# Patient Record
Sex: Female | Born: 1980 | Race: White | Hispanic: No | Marital: Single | Smoking: Former smoker
Health system: Southern US, Community
[De-identification: ages and names within clinical notes are randomized; demographics above are authoritative.]

## PROBLEM LIST (undated history)

## (undated) DIAGNOSIS — N12 Tubulo-interstitial nephritis, not specified as acute or chronic: Secondary | ICD-10-CM

## (undated) DIAGNOSIS — K219 Gastro-esophageal reflux disease without esophagitis: Secondary | ICD-10-CM

## (undated) DIAGNOSIS — R519 Headache, unspecified: Secondary | ICD-10-CM

## (undated) DIAGNOSIS — T884XXA Failed or difficult intubation, initial encounter: Secondary | ICD-10-CM

## (undated) DIAGNOSIS — R51 Headache: Secondary | ICD-10-CM

## (undated) DIAGNOSIS — F419 Anxiety disorder, unspecified: Secondary | ICD-10-CM

## (undated) DIAGNOSIS — M26629 Arthralgia of temporomandibular joint, unspecified side: Secondary | ICD-10-CM

## (undated) DIAGNOSIS — E041 Nontoxic single thyroid nodule: Secondary | ICD-10-CM

## (undated) DIAGNOSIS — Z87442 Personal history of urinary calculi: Secondary | ICD-10-CM

## (undated) DIAGNOSIS — M797 Fibromyalgia: Secondary | ICD-10-CM

## (undated) DIAGNOSIS — Z8489 Family history of other specified conditions: Secondary | ICD-10-CM

## (undated) DIAGNOSIS — T4145XA Adverse effect of unspecified anesthetic, initial encounter: Secondary | ICD-10-CM

## (undated) DIAGNOSIS — E162 Hypoglycemia, unspecified: Secondary | ICD-10-CM

## (undated) DIAGNOSIS — D649 Anemia, unspecified: Secondary | ICD-10-CM

## (undated) DIAGNOSIS — R569 Unspecified convulsions: Secondary | ICD-10-CM

## (undated) DIAGNOSIS — T8859XA Other complications of anesthesia, initial encounter: Secondary | ICD-10-CM

## (undated) DIAGNOSIS — C801 Malignant (primary) neoplasm, unspecified: Secondary | ICD-10-CM

## (undated) DIAGNOSIS — IMO0001 Reserved for inherently not codable concepts without codable children: Secondary | ICD-10-CM

## (undated) HISTORY — PX: TENDON REPAIR: SHX5111

## (undated) HISTORY — PX: OTHER SURGICAL HISTORY: SHX169

## (undated) HISTORY — PX: WISDOM TOOTH EXTRACTION: SHX21

## (undated) HISTORY — PX: LAPAROSCOPIC ABDOMINAL EXPLORATION: SHX6249

---

## 2012-11-26 DIAGNOSIS — IMO0001 Reserved for inherently not codable concepts without codable children: Secondary | ICD-10-CM | POA: Insufficient documentation

## 2012-11-26 DIAGNOSIS — J40 Bronchitis, not specified as acute or chronic: Secondary | ICD-10-CM | POA: Insufficient documentation

## 2012-11-26 DIAGNOSIS — E282 Polycystic ovarian syndrome: Secondary | ICD-10-CM | POA: Insufficient documentation

## 2012-11-26 DIAGNOSIS — M4120 Other idiopathic scoliosis, site unspecified: Secondary | ICD-10-CM | POA: Insufficient documentation

## 2012-11-26 DIAGNOSIS — E162 Hypoglycemia, unspecified: Secondary | ICD-10-CM | POA: Insufficient documentation

## 2014-11-12 ENCOUNTER — Emergency Department (HOSPITAL_BASED_OUTPATIENT_CLINIC_OR_DEPARTMENT_OTHER)
Admission: EM | Admit: 2014-11-12 | Discharge: 2014-11-12 | Disposition: A | Discharge status: Home / Self Care | Payer: Medicaid Other | Attending: Emergency Medicine | Admitting: Emergency Medicine

## 2014-11-12 ENCOUNTER — Emergency Department (HOSPITAL_BASED_OUTPATIENT_CLINIC_OR_DEPARTMENT_OTHER): Payer: Medicaid Other

## 2014-11-12 ENCOUNTER — Encounter (HOSPITAL_BASED_OUTPATIENT_CLINIC_OR_DEPARTMENT_OTHER): Payer: Self-pay | Admitting: *Deleted

## 2014-11-12 DIAGNOSIS — Z3202 Encounter for pregnancy test, result negative: Secondary | ICD-10-CM | POA: Insufficient documentation

## 2014-11-12 DIAGNOSIS — K429 Umbilical hernia without obstruction or gangrene: Secondary | ICD-10-CM | POA: Insufficient documentation

## 2014-11-12 DIAGNOSIS — R1031 Right lower quadrant pain: Secondary | ICD-10-CM | POA: Diagnosis present

## 2014-11-12 LAB — CBC WITH DIFFERENTIAL/PLATELET
Basophils Absolute: 0 10*3/uL (ref 0.0–0.1)
Basophils Relative: 0 % (ref 0–1)
Eosinophils Absolute: 0.4 10*3/uL (ref 0.0–0.7)
Eosinophils Relative: 4 % (ref 0–5)
HEMATOCRIT: 45.4 % (ref 36.0–46.0)
Hemoglobin: 15.2 g/dL — ABNORMAL HIGH (ref 12.0–15.0)
LYMPHS ABS: 3.1 10*3/uL (ref 0.7–4.0)
Lymphocytes Relative: 28 % (ref 12–46)
MCH: 30.3 pg (ref 26.0–34.0)
MCHC: 33.5 g/dL (ref 30.0–36.0)
MCV: 90.4 fL (ref 78.0–100.0)
MONOS PCT: 7 % (ref 3–12)
Monocytes Absolute: 0.8 10*3/uL (ref 0.1–1.0)
NEUTROS ABS: 6.9 10*3/uL (ref 1.7–7.7)
NEUTROS PCT: 61 % (ref 43–77)
Platelets: 180 10*3/uL (ref 150–400)
RBC: 5.02 MIL/uL (ref 3.87–5.11)
RDW: 13.3 % (ref 11.5–15.5)
WBC: 11.2 10*3/uL — AB (ref 4.0–10.5)

## 2014-11-12 LAB — COMPREHENSIVE METABOLIC PANEL
ALK PHOS: 48 U/L (ref 38–126)
ALT: 13 U/L — AB (ref 14–54)
AST: 16 U/L (ref 15–41)
Albumin: 4.3 g/dL (ref 3.5–5.0)
Anion gap: 10 (ref 5–15)
BUN: 8 mg/dL (ref 6–20)
CO2: 24 mmol/L (ref 22–32)
CREATININE: 0.77 mg/dL (ref 0.44–1.00)
Calcium: 9.3 mg/dL (ref 8.9–10.3)
Chloride: 106 mmol/L (ref 101–111)
GFR calc non Af Amer: >60 mL/min (ref >60)
Glucose, Bld: 86 mg/dL (ref 65–99)
Potassium: 3.9 mmol/L (ref 3.5–5.1)
Sodium: 140 mmol/L (ref 135–145)
Total Bilirubin: 0.8 mg/dL (ref 0.3–1.2)
Total Protein: 7.1 g/dL (ref 6.5–8.1)

## 2014-11-12 LAB — URINALYSIS, ROUTINE W REFLEX MICROSCOPIC
BILIRUBIN URINE: NEGATIVE
GLUCOSE, UA: NEGATIVE mg/dL
HGB URINE DIPSTICK: NEGATIVE
Ketones, ur: 15 mg/dL — AB
Leukocytes, UA: NEGATIVE
NITRITE: NEGATIVE
PH: 6.5 (ref 5.0–8.0)
PROTEIN: NEGATIVE mg/dL
SPECIFIC GRAVITY, URINE: 1.019 (ref 1.005–1.030)
UROBILINOGEN UA: 1 mg/dL (ref 0.0–1.0)

## 2014-11-12 LAB — PREGNANCY, URINE: PREG TEST UR: NEGATIVE

## 2014-11-12 LAB — LIPASE, BLOOD: Lipase: 41 U/L (ref 22–51)

## 2014-11-12 MED ORDER — IOHEXOL 300 MG/ML  SOLN
100.0000 mL | Freq: Once | INTRAMUSCULAR | Status: AC | PRN
  Administered 2014-11-12: 100 mL via INTRAVENOUS

## 2014-11-12 MED ORDER — ONDANSETRON HCL 4 MG PO TABS: 4.0000 mg | ORAL_TABLET | Freq: Four times a day (QID) | ORAL | Status: DC

## 2014-11-12 MED ORDER — IOHEXOL 300 MG/ML  SOLN
50.0000 mL | Freq: Once | INTRAMUSCULAR | Status: AC | PRN
  Administered 2014-11-12: 50 mL via ORAL

## 2014-11-12 MED ORDER — OXYCODONE-ACETAMINOPHEN 5-325 MG PO TABS: 1.0000 | ORAL_TABLET | Freq: Four times a day (QID) | ORAL | Status: DC | PRN

## 2014-11-12 NOTE — ED Notes (Signed)
Patient transported to CT 

## 2014-11-12 NOTE — ED Provider Notes (Signed)
CSN: [id number]     Arrival date & time 11/12/14  1539 History   First MD Initiated Contact with Patient 11/12/14 1606     Chief Complaint  Patient presents with  . Hernia     (Consider location/radiation/quality/duration/timing/severity/associated sxs/prior Treatment) HPI   PCP: No PCP Per Patient Blood pressure 108/57, pulse 94, temperature 98.3 F (36.8 C), temperature source Oral, resp. rate 18, height 5\' 5"  (1.651 m), weight 140 lb (63.504 kg), last menstrual period 10/22/2014, SpO2 100 %.  Kylie Curtis is a 33 y.o.female with a significant PMH of right hand surgery presents to the ER with complaints of nominal pain and nausea for 4 days. Sent here from [place] [facility] for imaging and blood work. The patient has had pain for 4 days she describes as sharp in the period umbilical region and radiates to the right lower quadrant, left upper quadrant area.  she describes it as sharp and intermittently crampy pains. Is not had any vomiting or diarrhea. She denies dysuria, hematuria, SOB, CP, headaches, weakness fatigue or any other associated symptoms.   History reviewed. No pertinent past medical history. Past Surgical History  Procedure Laterality Date  . Right hand surgery     History reviewed. No pertinent family history. History  Substance Use Topics  . Smoking status: Never Smoker   . Smokeless tobacco: Not on file  . Alcohol Use: No   OB History    No data available     Review of Systems  10 Systems reviewed and are negative for acute change except as noted in the HPI.     Allergies  Codeine; Morphine and related; and Tramadol  Home Medications   Prior to Admission medications   Not on File   BP 108/57 mmHg  Pulse 94  Temp(Src) 98.3 F (36.8 C) (Oral)  Resp 18  Ht 5\' 5"  (1.651 m)  Wt 140 lb (63.504 kg)  BMI 23.30 kg/m2  SpO2 100%  LMP 10/22/2014 Physical Exam  Constitutional: She appears well-developed and well-nourished. No distress.   HENT:  Head: Normocephalic and atraumatic.  Eyes: Pupils are equal, round, and reactive to light.  Neck: Normal range of motion. Neck supple.  Cardiovascular: Normal rate and regular rhythm.   Pulmonary/Chest: Effort normal and breath sounds normal. She has no decreased breath sounds. She has no wheezes.  Abdominal: Soft. Bowel sounds are normal. She exhibits no distension and no fluid wave. There is tenderness in the right lower quadrant, periumbilical area and left upper quadrant. There is no rigidity, no rebound and no guarding.  Neurological: She is alert.  Skin: Skin is warm and dry.  Nursing note and vitals reviewed.   ED Course  Procedures (including critical care time) Labs Review Labs Reviewed  COMPREHENSIVE METABOLIC PANEL  LIPASE, BLOOD  CBC WITH DIFFERENTIAL/PLATELET  URINALYSIS, ROUTINE W REFLEX MICROSCOPIC  PREGNANCY, URINE    Imaging Review No results found.   EKG Interpretation None      MDM   Final diagnoses:  Umbilical hernia     Patient's lab work and CT scan are reassuring- No acute or emergent findings.. Physical exam patient does not currently have any palpable hernia. I will refer her to [facility] surgery for elective hernia repair. I've discussed the results and the plan with the patient. I've discussed return to emergency Department precautions.  Declined pain medication while in the emergency department but has agreed to take a prescription for Zofran and Percocet in case her pain returns.  She's been advised that if her hernia pops out and she is unable to reduce it herself that she will need to return to the emergency department.  34 y.o.Kylie Curtis's evaluation in the Emergency Department is complete. It has been determined that no acute conditions requiring further emergency intervention are present at this time. The patient/guardian have been advised of the diagnosis and plan. We have discussed signs and symptoms that warrant  return to the ED, such as changes or worsening in symptoms.  Vital signs are stable at discharge. Filed Vitals:   11/12/14 1834  BP: 107/64  Pulse: 66  Temp:   Resp: 16    Patient/guardian has voiced understanding and agreed to follow-up with the PCP or specialist.   Delos Haring, PA-C 11/12/14 [place], MD 11/12/14 2328

## 2014-11-12 NOTE — Discharge Instructions (Signed)
Hernia A hernia occurs when an internal organ pushes out through a weak spot in the abdominal wall. Hernias most commonly occur in the groin and around the navel. Hernias often can be pushed back into place (reduced). Most hernias tend to get worse over time. Some abdominal hernias can get stuck in the opening (irreducible or incarcerated hernia) and cannot be reduced. An irreducible abdominal hernia which is tightly squeezed into the opening is at risk for impaired blood supply (strangulated hernia). A strangulated hernia is a medical emergency. Because of the risk for an irreducible or strangulated hernia, surgery may be recommended to repair a hernia. CAUSES   Heavy lifting.  Prolonged coughing.  Straining to have a bowel movement.  A cut (incision) made during an abdominal surgery. HOME CARE INSTRUCTIONS   Bed rest is not required. You may continue your normal activities.  Avoid lifting more than 10 pounds (4.5 kg) or straining.  Cough gently. If you are a smoker it is best to stop. Even the best hernia repair can break down with the continual strain of coughing. Even if you do not have your hernia repaired, a cough will continue to aggravate the problem.  Do not wear anything tight over your hernia. Do not try to keep it in with an outside bandage or truss. These can damage abdominal contents if they are trapped within the hernia sac.  Eat a normal diet.  Avoid constipation. Straining over long periods of time will increase hernia size and encourage breakdown of repairs. If you cannot do this with diet alone, stool softeners may be used. SEEK IMMEDIATE MEDICAL CARE IF:   You have a fever.  You develop increasing abdominal pain.  You feel nauseous or vomit.  Your hernia is stuck outside the abdomen, looks discolored, feels hard, or is tender.  You have any changes in your bowel habits or in the hernia that are unusual for you.  You have increased pain or swelling around the  hernia.  You cannot push the hernia back in place by applying gentle pressure while lying down. MAKE SURE YOU:   Understand these instructions.  Will watch your condition.  Will get help right away if you are not doing well or get worse. Document Released: 06/08/2005 Document Revised: 08/31/2011 Document Reviewed: 01/26/2008 [facility] Patient Information 2015 [place], [place]. This information is not intended to replace advice given to you by your health care provider. Make sure you discuss any questions you have with your health care provider.  Hernia Repair with Laparoscope A hernia occurs when an internal organ pushes out through a weak spot in the belly (abdominal) wall muscles. Hernias most commonly occur in the groin and around the navel. Hernias can also occur through a cut by the surgeon (incision) after an abdominal operation. A hernia may be caused by:  Lifting heavy objects.  Prolonged coughing.  Straining to move your bowels. Hernias can often be pushed back into place (reduced). Most hernias tend to get worse over time. Problems occur when abdominal contents get stuck in the opening and the blood supply is blocked or impaired (incarcerated hernia). Because of these risks, you require surgery to repair the hernia. Your hernia will be repaired using a laparoscope. Laparoscopic surgery is a type of minimally invasive surgery. It does not involve making a typical surgical cut (incision) in the skin. A laparoscope is a telescope-like rod and lens system. It is usually connected to a video camera and a light source so your caregiver  can clearly see the operative area. The instruments are inserted through  to  inch (5 mm or 10 mm) openings in the skin at specific locations. A working and viewing space is created by blowing a small amount of carbon dioxide gas into the abdominal cavity. The abdomen is essentially blown up like a balloon (insufflated). This elevates the abdominal wall above  the internal organs like a dome. The carbon dioxide gas is common to the human body and can be absorbed by tissue and removed by the respiratory system. Once the repair is completed, the small incisions will be closed with either stitches (sutures) or staples (just like a paper stapler only this staple holds the skin together). LET YOUR CAREGIVERS KNOW ABOUT:  Allergies.  Medications taken including herbs, eye drops, over the counter medications, and creams.  Use of steroids (by mouth or creams).  Previous problems with anesthetics or Novocaine.  Possibility of pregnancy, if this applies.  History of blood clots (thrombophlebitis).  History of bleeding or blood problems.  Previous surgery.  Other health problems. BEFORE THE PROCEDURE  Laparoscopy can be done either in a hospital or out-patient clinic. You may be given a mild sedative to help you relax before the procedure. Once in the operating room, you will be given a general anesthesia to make you sleep (unless you and your caregiver choose a different anesthetic).  AFTER THE PROCEDURE  After the procedure you will be watched in a recovery area. Depending on what type of hernia was repaired, you might be admitted to the hospital or you might go home the same day. With this procedure you may have less pain and scarring. This usually results in a quicker recovery and less risk of infection. HOME CARE INSTRUCTIONS   Bed rest is not required. You may continue your normal activities but avoid heavy lifting (more than 10 pounds) or straining.  Cough gently. If you are a smoker it is best to stop, as even the best hernia repair can break down with the continual strain of coughing.  Avoid driving until given the OK by your surgeon.  There are no dietary restrictions unless given otherwise.  TAKE ALL MEDICATIONS AS DIRECTED.  Only take over-the-counter or prescription medicines for pain, discomfort, or fever as directed by your  caregiver. SEEK MEDICAL CARE IF:   There is increasing abdominal pain or pain in your incisions.  There is more bleeding from incisions, other than minimal spotting.  You feel light headed or faint.  You develop an unexplained fever, chills, and/or an oral temperature above 102 F (38.9 C).  You have redness, swelling, or increasing pain in the wound.  Pus coming from wound.  A foul smell coming from the wound or dressings. SEEK IMMEDIATE MEDICAL CARE IF:   You develop a rash.  You have difficulty breathing.  You have any allergic problems. MAKE SURE YOU:   Understand these instructions.  Will watch your condition.  Will get help right away if you are not doing well or get worse. Document Released: 06/08/2005 Document Revised: 08/31/2011 Document Reviewed: 05/08/2009 [facility] Patient Information 2015 [place], [place]. This information is not intended to replace advice given to you by your health care provider. Make sure you discuss any questions you have with your health care provider.

## 2014-11-12 NOTE — ED Notes (Signed)
Umbilical hernia pain x 4 days.  Nauseated, denies vomiting.

## 2014-12-11 ENCOUNTER — Ambulatory Visit: Payer: Self-pay | Admitting: General Surgery

## 2014-12-11 NOTE — H&P (Signed)
History of Present Illness Kylie Ok MD; 11/28/2014 2:54 PM) Patient words: umb hernia.  The patient is a 34 year old female who presents with an umbilical hernia. Patient is a 34 year old female who is here today secondary to an umbilical hernia. Patient states that she noticed a hernia approximate 4 years ago while being pregnant with her child. She states that after giving birth she had no pain. She states that pain is recently presented self. It waxes and wanes. Patient has had no signs or symptoms of incarceration or strangulation. She is able to manually reduce it.   Other Problems Elbert Ewings, CMA; 11/28/2014 2:23 PM) Back Pain Kidney Stone Migraine Headache Other disease, cancer, significant illness Umbilical Hernia Repair  Diagnostic Studies History Elbert Ewings, CMA; 11/28/2014 2:23 PM) Colonoscopy never Mammogram never Pap Smear 1-5 years ago  Allergies Elbert Ewings, CMA; 11/28/2014 2:25 PM) Codeine Phosphate *ANALGESICS - OPIOID* Itching. Morphine Sulfate ER *ANALGESICS - OPIOID* Itching. TraMADol HCl *CHEMICALS* Itching.  Medication History Elbert Ewings, CMA; 11/28/2014 2:25 PM) Oxycodone-Acetaminophen (5-325MG  Tablet, Oral) Active. Medications Reconciled  Social History Elbert Ewings, [place]; 11/28/2014 2:23 PM) Alcohol use Occasional alcohol use. Caffeine use Coffee, Tea. No drug use Tobacco use Current every day smoker.  Family History Elbert Ewings, [place]; 11/28/2014 2:23 PM) Alcohol Abuse Family Members In General. Arthritis Father. Diabetes Mellitus Family Members In General. Heart Disease Family Members In General. Heart disease in female family member before age 54 Hypertension Family Members In General. Ovarian Cancer Mother. Seizure disorder Family Members In General. Thyroid problems Mother.  Pregnancy / Birth History Elbert Ewings, CMA; 11/28/2014 2:23 PM) Age at menarche 41 years. Contraceptive History Oral  contraceptives. Gravida 1 Irregular periods Maternal age 25-30 Para 1  Review of Systems Elbert Ewings CMA; 11/28/2014 2:23 PM) General Not Present- Appetite Loss, Chills, Fatigue, Fever, Night Sweats, Weight Gain and Weight Loss. Skin Not Present- Change in Wart/Mole, Dryness, Hives, Jaundice, New Lesions, Non-Healing Wounds, Rash and Ulcer. HEENT Present- Seasonal Allergies. Not Present- Earache, Hearing Loss, Hoarseness, Nose Bleed, Oral Ulcers, Ringing in the Ears, Sinus Pain, Sore Throat, Visual Disturbances, Wears glasses/contact lenses and Yellow Eyes. Respiratory Present- Wheezing. Not Present- Bloody sputum, Chronic Cough, Difficulty Breathing and Snoring. Breast Not Present- Breast Mass, Breast Pain, Nipple Discharge and Skin Changes. Cardiovascular Not Present- Chest Pain, Difficulty Breathing Lying Down, Leg Cramps, Palpitations, Rapid Heart Rate, Shortness of Breath and Swelling of Extremities. Gastrointestinal Present- Abdominal Pain, Bloating, Change in Bowel Habits, Excessive gas, Indigestion and Nausea. Not Present- Bloody Stool, Chronic diarrhea, Constipation, Difficulty Swallowing, Gets full quickly at meals, Hemorrhoids, Rectal Pain and Vomiting. Female Genitourinary Present- Pelvic Pain. Not Present- Frequency, Nocturia, Painful Urination and Urgency. Musculoskeletal Present- Back Pain, Joint Pain and Joint Stiffness. Not Present- Muscle Pain, Muscle Weakness and Swelling of Extremities. Neurological Present- Headaches, Numbness and Tingling. Not Present- Decreased Memory, Fainting, Seizures, Tremor, Trouble walking and Weakness. Psychiatric Not Present- Anxiety, Bipolar, Change in Sleep Pattern, Depression, Fearful and Frequent crying. Endocrine Present- Excessive Hunger and Hot flashes. Not Present- Cold Intolerance, Hair Changes, Heat Intolerance and New Diabetes. Hematology Not Present- Easy Bruising, Excessive bleeding, Gland problems, HIV and Persistent  Infections.   Vitals Elbert Ewings CMA; 11/28/2014 2:26 PM) 11/28/2014 2:25 PM Weight: 140 lb Height: 65in Body Surface Area: 1.71 m Body Mass Index: 23.3 kg/m Temp.: 98.82F(Oral)  Pulse: 86 (Regular)  Resp.: 17 (Unlabored)  BP: 130/70 (Sitting, Left Arm, Standard)    Physical Exam Kylie Ok MD; 11/28/2014 2:54 PM) General Mental Status-Alert.  General Appearance-Consistent with stated age. Hydration-Well hydrated. Voice-Normal.  Head and Neck Head-normocephalic, atraumatic with no lesions or palpable masses. Trachea-midline. Thyroid Gland Characteristics - normal size and consistency.  Chest and Lung Exam Chest and lung exam reveals -quiet, even and easy respiratory effort with no use of accessory muscles and on auscultation, normal breath sounds, no adventitious sounds and normal vocal resonance. Inspection Chest Wall - Normal. Back - normal.  Cardiovascular Cardiovascular examination reveals -normal heart sounds, regular rate and rhythm with no murmurs and normal pedal pulses bilaterally.  Abdomen Inspection Skin - Scar - no surgical scars. Hernias - Umbilical hernia - Incarcerated. Palpation/Percussion Normal exam - Soft, Non Tender, No Rebound tenderness, No Rigidity (guarding) and No hepatosplenomegaly. Auscultation Normal exam - Bowel sounds normal.    Assessment & Plan Kylie Ok MD; 08/28/298 9:23 PM) UMBILICAL HERNIA WITHOUT OBSTRUCTION AND WITHOUT GANGRENE (553.1  K42.9) Impression: 34 year old female with a reducible umbilical hernia  1. The patient will like to proceed to the operating for a laparoscopic umbilical hernia repair with mesh. 2. All risks and benefits were discussed with the patient to generally include, but not limited to: infection, bleeding, damage to surrounding structures, acute and chronic nerve pain, and recurrence. Alternatives were offered and described. All questions were answered and the patient  voiced understanding of the procedure and wishes to proceed at this point with hernia repair.

## 2014-12-14 ENCOUNTER — Ambulatory Visit: Payer: Self-pay | Admitting: General Surgery

## 2014-12-14 ENCOUNTER — Encounter (HOSPITAL_COMMUNITY): Payer: Self-pay

## 2014-12-14 ENCOUNTER — Encounter (HOSPITAL_COMMUNITY)
Admission: RE | Admit: 2014-12-14 | Discharge: 2014-12-14 | Disposition: A | Discharge status: Home / Self Care | Payer: Medicaid Other | Source: Ambulatory Visit | Attending: General Surgery | Admitting: General Surgery

## 2014-12-14 ENCOUNTER — Encounter (INDEPENDENT_AMBULATORY_CARE_PROVIDER_SITE_OTHER): Payer: Self-pay

## 2014-12-14 DIAGNOSIS — Z01812 Encounter for preprocedural laboratory examination: Secondary | ICD-10-CM | POA: Diagnosis present

## 2014-12-14 DIAGNOSIS — K429 Umbilical hernia without obstruction or gangrene: Secondary | ICD-10-CM | POA: Insufficient documentation

## 2014-12-14 HISTORY — DX: Adverse effect of unspecified anesthetic, initial encounter: T41.45XA

## 2014-12-14 HISTORY — DX: Gastro-esophageal reflux disease without esophagitis: K21.9

## 2014-12-14 HISTORY — DX: Reserved for inherently not codable concepts without codable children: IMO0001

## 2014-12-14 HISTORY — DX: Anxiety disorder, unspecified: F41.9

## 2014-12-14 HISTORY — DX: Headache, unspecified: R51.9

## 2014-12-14 HISTORY — DX: Other complications of anesthesia, initial encounter: T88.59XA

## 2014-12-14 HISTORY — DX: Fibromyalgia: M79.7

## 2014-12-14 HISTORY — DX: Tubulo-interstitial nephritis, not specified as acute or chronic: N12

## 2014-12-14 HISTORY — DX: Headache: R51

## 2014-12-14 HISTORY — DX: Hypoglycemia, unspecified: E16.2

## 2014-12-14 LAB — HCG, SERUM, QUALITATIVE: Preg, Serum: NEGATIVE

## 2014-12-14 NOTE — Pre-Procedure Instructions (Signed)
    Kylie Curtis  12/14/2014      Your procedure is scheduled on Tuesday, June 28.  Report to [facility] Admitting at 9:45A.M.               Your surgery is scheduled for 11:49AM   Call this number if you have problems the morning of surgery: [phone]               For any other questions, please call [phone], Monday - Friday 8 AM - 4 PM.    Remember:  Do not eat food or drink liquids after midnight  Monday, June 27.  Take these medicines the morning of surgery with A SIP OF WATER :JOLIVETTE.                 Take if needed:oxyCODONE-acetaminophen (PERCOCET/ROXICET).                  Stop taking Aspirin, Coumadin, Plavix, Effient and Herbal medications.  Do not take any NSAIDs ie: Ibuprofen,  Advil,Naproxen or any medication containing Aspirin.  Do not wear jewelry, make-up or nail polish.  Do not wear lotions, powders, or perfumes.    Do not shave 48 hours prior to surgery.    Do not bring valuables to the hospital.  [facility] is not responsible for any belongings or valuables.  Contacts, dentures or bridgework may not be worn into surgery.  Leave your suitcase in the car.  After surgery it may be brought to your room.  For patients admitted to the hospital, discharge time will be determined by your treatment team.  Patients discharged the day of surgery will not be allowed to drive home.   Name and phone number of your driver:   -  Special instructions:  **Review  [facility] - Preparing For Surgery.  Please read over the following fact sheets that you were given. Pain Booklet, Coughing and Deep Breathing and Anesthesia Post-op Instructions

## 2014-12-17 MED ORDER — CEFAZOLIN SODIUM-DEXTROSE 2-3 GM-% IV SOLR
2.0000 g | INTRAVENOUS | Status: AC
  Administered 2014-12-18: 2 g via INTRAVENOUS

## 2014-12-18 ENCOUNTER — Ambulatory Visit (HOSPITAL_COMMUNITY): Payer: Medicaid Other | Admitting: Anesthesiology

## 2014-12-18 ENCOUNTER — Encounter (HOSPITAL_COMMUNITY)
Admission: RE | Disposition: A | Discharge status: Home / Self Care | Payer: Self-pay | Source: Ambulatory Visit | Attending: General Surgery

## 2014-12-18 ENCOUNTER — Ambulatory Visit (HOSPITAL_COMMUNITY)
Admission: RE | Admit: 2014-12-18 | Discharge: 2014-12-18 | Disposition: A | Discharge status: Home / Self Care | Payer: Medicaid Other | Source: Ambulatory Visit | Attending: General Surgery | Admitting: General Surgery

## 2014-12-18 ENCOUNTER — Encounter (HOSPITAL_COMMUNITY): Payer: Self-pay

## 2014-12-18 DIAGNOSIS — K429 Umbilical hernia without obstruction or gangrene: Secondary | ICD-10-CM | POA: Diagnosis present

## 2014-12-18 DIAGNOSIS — K219 Gastro-esophageal reflux disease without esophagitis: Secondary | ICD-10-CM | POA: Insufficient documentation

## 2014-12-18 DIAGNOSIS — Z79891 Long term (current) use of opiate analgesic: Secondary | ICD-10-CM | POA: Diagnosis not present

## 2014-12-18 DIAGNOSIS — F17200 Nicotine dependence, unspecified, uncomplicated: Secondary | ICD-10-CM | POA: Diagnosis not present

## 2014-12-18 DIAGNOSIS — Z859 Personal history of malignant neoplasm, unspecified: Secondary | ICD-10-CM | POA: Diagnosis not present

## 2014-12-18 DIAGNOSIS — G43909 Migraine, unspecified, not intractable, without status migrainosus: Secondary | ICD-10-CM | POA: Diagnosis not present

## 2014-12-18 HISTORY — PX: UMBILICAL HERNIA REPAIR: SHX196

## 2014-12-18 HISTORY — DX: Failed or difficult intubation, initial encounter: T88.4XXA

## 2014-12-18 HISTORY — PX: INSERTION OF MESH: SHX5868

## 2014-12-18 LAB — CBC
HCT: 41.1 % (ref 36.0–46.0)
HEMOGLOBIN: 13.8 g/dL (ref 12.0–15.0)
MCH: 30 pg (ref 26.0–34.0)
MCHC: 33.6 g/dL (ref 30.0–36.0)
MCV: 89.3 fL (ref 78.0–100.0)
PLATELETS: 162 10*3/uL (ref 150–400)
RBC: 4.6 MIL/uL (ref 3.87–5.11)
RDW: 13.5 % (ref 11.5–15.5)
WBC: 7.1 10*3/uL (ref 4.0–10.5)

## 2014-12-18 SURGERY — REPAIR, HERNIA, UMBILICAL, LAPAROSCOPIC
Anesthesia: General | Site: Abdomen

## 2014-12-18 MED ORDER — LACTATED RINGERS IV SOLN
INTRAVENOUS | Status: DC
  Administered 2014-12-18: 11:00:00 via INTRAVENOUS

## 2014-12-18 MED ORDER — DIPHENHYDRAMINE HCL 50 MG/ML IJ SOLN
INTRAMUSCULAR | Status: DC | PRN
  Administered 2014-12-18: 12.5 mg via INTRAVENOUS

## 2014-12-18 MED ORDER — PROPOFOL 10 MG/ML IV BOLUS
INTRAVENOUS | Status: DC | PRN
  Administered 2014-12-18: 130 mg via INTRAVENOUS

## 2014-12-18 MED ORDER — ONDANSETRON HCL 4 MG/2ML IJ SOLN
INTRAMUSCULAR | Status: DC | PRN
  Administered 2014-12-18: 4 mg via INTRAVENOUS

## 2014-12-18 MED ORDER — BUPIVACAINE HCL (PF) 0.25 % IJ SOLN
INTRAMUSCULAR | Status: AC
  Filled 2014-12-18: qty 30

## 2014-12-18 MED ORDER — ROCURONIUM BROMIDE 50 MG/5ML IV SOLN
INTRAVENOUS | Status: AC
  Filled 2014-12-18: qty 1

## 2014-12-18 MED ORDER — OXYCODONE HCL 5 MG PO TABS
5.0000 mg | ORAL_TABLET | ORAL | Status: DC | PRN
  Administered 2014-12-18: 10 mg via ORAL

## 2014-12-18 MED ORDER — HYDROMORPHONE HCL 1 MG/ML IJ SOLN
INTRAMUSCULAR | Status: AC
  Filled 2014-12-18: qty 1

## 2014-12-18 MED ORDER — ROCURONIUM BROMIDE 100 MG/10ML IV SOLN
INTRAVENOUS | Status: DC | PRN
  Administered 2014-12-18: 30 mg via INTRAVENOUS

## 2014-12-18 MED ORDER — DIPHENHYDRAMINE HCL 50 MG/ML IJ SOLN
INTRAMUSCULAR | Status: AC
  Filled 2014-12-18: qty 1

## 2014-12-18 MED ORDER — PROPOFOL 10 MG/ML IV BOLUS
INTRAVENOUS | Status: AC
  Filled 2014-12-18: qty 20

## 2014-12-18 MED ORDER — HYDROMORPHONE HCL 1 MG/ML IJ SOLN
INTRAMUSCULAR | Status: AC
  Administered 2014-12-18: 1 mg
  Filled 2014-12-18: qty 1

## 2014-12-18 MED ORDER — FENTANYL CITRATE (PF) 100 MCG/2ML IJ SOLN
INTRAMUSCULAR | Status: DC | PRN
  Administered 2014-12-18: 100 ug via INTRAVENOUS
  Administered 2014-12-18: 150 ug via INTRAVENOUS

## 2014-12-18 MED ORDER — DEXAMETHASONE SODIUM PHOSPHATE 4 MG/ML IJ SOLN
INTRAMUSCULAR | Status: DC | PRN
  Administered 2014-12-18: 4 mg via INTRAVENOUS

## 2014-12-18 MED ORDER — DEXAMETHASONE SODIUM PHOSPHATE 4 MG/ML IJ SOLN
INTRAMUSCULAR | Status: AC
  Filled 2014-12-18: qty 1

## 2014-12-18 MED ORDER — 0.9 % SODIUM CHLORIDE (POUR BTL) OPTIME
TOPICAL | Status: DC | PRN
  Administered 2014-12-18: 1000 mL

## 2014-12-18 MED ORDER — MIDAZOLAM HCL 2 MG/2ML IJ SOLN
INTRAMUSCULAR | Status: AC
  Filled 2014-12-18: qty 2

## 2014-12-18 MED ORDER — HYDROMORPHONE HCL 1 MG/ML IJ SOLN
0.2500 mg | INTRAMUSCULAR | Status: DC | PRN
  Administered 2014-12-18: 0.5 mg via INTRAVENOUS
  Administered 2014-12-18: 1 mg via INTRAVENOUS
  Administered 2014-12-18 (×2): 0.5 mg via INTRAVENOUS

## 2014-12-18 MED ORDER — ONDANSETRON HCL 4 MG/2ML IJ SOLN
INTRAMUSCULAR | Status: AC
  Filled 2014-12-18: qty 2

## 2014-12-18 MED ORDER — BUPIVACAINE HCL 0.25 % IJ SOLN
INTRAMUSCULAR | Status: DC | PRN
  Administered 2014-12-18: 5 mL

## 2014-12-18 MED ORDER — FENTANYL CITRATE (PF) 250 MCG/5ML IJ SOLN
INTRAMUSCULAR | Status: AC
  Filled 2014-12-18: qty 5

## 2014-12-18 MED ORDER — MIDAZOLAM HCL 5 MG/5ML IJ SOLN
INTRAMUSCULAR | Status: DC | PRN
  Administered 2014-12-18: 2 mg via INTRAVENOUS

## 2014-12-18 MED ORDER — SUGAMMADEX SODIUM 200 MG/2ML IV SOLN
INTRAVENOUS | Status: DC | PRN
  Administered 2014-12-18: 120 mg via INTRAVENOUS

## 2014-12-18 MED ORDER — CHLORHEXIDINE GLUCONATE 4 % EX LIQD: 1.0000 "application " | Freq: Once | CUTANEOUS | Status: DC

## 2014-12-18 MED ORDER — LIDOCAINE HCL (CARDIAC) 20 MG/ML IV SOLN
INTRAVENOUS | Status: DC | PRN
Start: 2014-12-18 — End: 2014-12-18
  Administered 2014-12-18: 40 mg via INTRAVENOUS
  Administered 2014-12-18: 60 mg via INTRATRACHEAL

## 2014-12-18 MED ORDER — ARTIFICIAL TEARS OP OINT
TOPICAL_OINTMENT | OPHTHALMIC | Status: AC
  Filled 2014-12-18: qty 3.5

## 2014-12-18 MED ORDER — OXYCODONE-ACETAMINOPHEN 5-325 MG PO TABS: 1.0000 | ORAL_TABLET | ORAL | Status: DC | PRN

## 2014-12-18 MED ORDER — OXYCODONE HCL 5 MG PO TABS
ORAL_TABLET | ORAL | Status: AC
  Filled 2014-12-18: qty 2

## 2014-12-18 SURGICAL SUPPLY — 54 items
APPLIER CLIP LOGIC TI 5 (MISCELLANEOUS) IMPLANT
APPLIER CLIP ROT 10 11.4 M/L (STAPLE)
BENZOIN TINCTURE PRP APPL 2/3 (GAUZE/BANDAGES/DRESSINGS) ×3 IMPLANT
BLADE SURG ROTATE 9660 (MISCELLANEOUS) IMPLANT
CANISTER SUCTION 2500CC (MISCELLANEOUS) IMPLANT
CHLORAPREP W/TINT 26ML (MISCELLANEOUS) ×3 IMPLANT
CLIP APPLIE ROT 10 11.4 M/L (STAPLE) IMPLANT
CLOSURE WOUND 1/2 X4 (GAUZE/BANDAGES/DRESSINGS) ×1
COVER SURGICAL LIGHT HANDLE (MISCELLANEOUS) ×3 IMPLANT
DEVICE SECURE STRAP 25 ABSORB (INSTRUMENTS) ×3 IMPLANT
DEVICE TROCAR PUNCTURE CLOSURE (ENDOMECHANICALS) ×3 IMPLANT
DRAPE LAPAROSCOPIC ABDOMINAL (DRAPES) ×3 IMPLANT
ELECT REM PT RETURN 9FT ADLT (ELECTROSURGICAL) ×3
ELECTRODE REM PT RTRN 9FT ADLT (ELECTROSURGICAL) ×1 IMPLANT
GAUZE SPONGE 4X4 12PLY STRL (GAUZE/BANDAGES/DRESSINGS) ×3 IMPLANT
GLOVE BIO SURGEON STRL SZ 6.5 (GLOVE) ×2 IMPLANT
GLOVE BIO SURGEON STRL SZ7 (GLOVE) ×3 IMPLANT
GLOVE BIO SURGEON STRL SZ7.5 (GLOVE) ×3 IMPLANT
GLOVE BIO SURGEONS STRL SZ 6.5 (GLOVE) ×1
GLOVE BIOGEL PI IND STRL 7.0 (GLOVE) ×3 IMPLANT
GLOVE BIOGEL PI IND STRL 7.5 (GLOVE) ×1 IMPLANT
GLOVE BIOGEL PI IND STRL 8 (GLOVE) ×1 IMPLANT
GLOVE BIOGEL PI INDICATOR 7.0 (GLOVE) ×6
GLOVE BIOGEL PI INDICATOR 7.5 (GLOVE) ×2
GLOVE BIOGEL PI INDICATOR 8 (GLOVE) ×2
GLOVE ECLIPSE 7.5 STRL STRAW (GLOVE) ×6 IMPLANT
GOWN STRL REUS W/ TWL LRG LVL3 (GOWN DISPOSABLE) ×3 IMPLANT
GOWN STRL REUS W/ TWL XL LVL3 (GOWN DISPOSABLE) ×2 IMPLANT
GOWN STRL REUS W/TWL LRG LVL3 (GOWN DISPOSABLE) ×6
GOWN STRL REUS W/TWL XL LVL3 (GOWN DISPOSABLE) ×4
KIT BASIN OR (CUSTOM PROCEDURE TRAY) ×3 IMPLANT
KIT ROOM TURNOVER OR (KITS) ×3 IMPLANT
MARKER SKIN DUAL TIP RULER LAB (MISCELLANEOUS) ×3 IMPLANT
MESH PARIETEX 4.7 (Mesh General) ×3 IMPLANT
NEEDLE INSUFFLATION 14GA 120MM (NEEDLE) ×3 IMPLANT
NEEDLE SPNL 22GX3.5 QUINCKE BK (NEEDLE) ×3 IMPLANT
NS IRRIG 1000ML POUR BTL (IV SOLUTION) ×3 IMPLANT
PAD ARMBOARD 7.5X6 YLW CONV (MISCELLANEOUS) ×6 IMPLANT
SCISSORS LAP 5X35 DISP (ENDOMECHANICALS) ×3 IMPLANT
SET IRRIG TUBING LAPAROSCOPIC (IRRIGATION / IRRIGATOR) IMPLANT
SLEEVE ENDOPATH XCEL 5M (ENDOMECHANICALS) ×3 IMPLANT
STRIP CLOSURE SKIN 1/2X4 (GAUZE/BANDAGES/DRESSINGS) ×2 IMPLANT
SUT CHROMIC 2 0 SH (SUTURE) ×3 IMPLANT
SUT MNCRL AB 4-0 PS2 18 (SUTURE) ×3 IMPLANT
SUT PROLENE 2 0 KS (SUTURE) ×3 IMPLANT
TAPE CLOTH SURG 4X10 WHT LF (GAUZE/BANDAGES/DRESSINGS) ×3 IMPLANT
TOWEL OR 17X24 6PK STRL BLUE (TOWEL DISPOSABLE) ×3 IMPLANT
TOWEL OR 17X26 10 PK STRL BLUE (TOWEL DISPOSABLE) IMPLANT
TRAY FOLEY CATH 14FR (SET/KITS/TRAYS/PACK) IMPLANT
TRAY LAPAROSCOPIC (CUSTOM PROCEDURE TRAY) ×3 IMPLANT
TROCAR XCEL BLUNT TIP 100MML (ENDOMECHANICALS) IMPLANT
TROCAR XCEL NON-BLD 11X100MML (ENDOMECHANICALS) IMPLANT
TROCAR XCEL NON-BLD 5MMX100MML (ENDOMECHANICALS) ×3 IMPLANT
TUBING INSUFFLATION (TUBING) ×3 IMPLANT

## 2014-12-18 NOTE — H&P (View-Only) (Signed)
History of Present Illness Kylie Ok MD; 11/28/2014 2:54 PM) Patient words: umb hernia.  The patient is a 34 year old female who presents with an umbilical hernia. Patient is a 34 year old female who is here today secondary to an umbilical hernia. Patient states that she noticed a hernia approximate 4 years ago while being pregnant with her child. She states that after giving birth she had no pain. She states that pain is recently presented self. It waxes and wanes. Patient has had no signs or symptoms of incarceration or strangulation. She is able to manually reduce it.   Other Problems Kylie Curtis, CMA; 11/28/2014 2:23 PM) Back Pain Kidney Stone Migraine Headache Other disease, cancer, significant illness Umbilical Hernia Repair  Diagnostic Studies History Kylie Curtis, CMA; 11/28/2014 2:23 PM) Colonoscopy never Mammogram never Pap Smear 1-5 years ago  Allergies Kylie Curtis, CMA; 11/28/2014 2:25 PM) Codeine Phosphate *ANALGESICS - OPIOID* Itching. Morphine Sulfate ER *ANALGESICS - OPIOID* Itching. TraMADol HCl *CHEMICALS* Itching.  Medication History Kylie Curtis, CMA; 11/28/2014 2:25 PM) Oxycodone-Acetaminophen (5-325MG  Tablet, Oral) Active. Medications Reconciled  Social History Kylie Curtis, [place]; 11/28/2014 2:23 PM) Alcohol use Occasional alcohol use. Caffeine use Coffee, Tea. No drug use Tobacco use Current every day smoker.  Family History Kylie Curtis, [place]; 11/28/2014 2:23 PM) Alcohol Abuse Family Members In General. Arthritis Father. Diabetes Mellitus Family Members In General. Heart Disease Family Members In General. Heart disease in female family member before age 73 Hypertension Family Members In General. Ovarian Cancer Mother. Seizure disorder Family Members In General. Thyroid problems Mother.  Pregnancy / Birth History Kylie Curtis, CMA; 11/28/2014 2:23 PM) Age at menarche 43 years. Contraceptive History Oral  contraceptives. Gravida 1 Irregular periods Maternal age 75-30 Para 1  Review of Systems Kylie Curtis CMA; 11/28/2014 2:23 PM) General Not Present- Appetite Loss, Chills, Fatigue, Fever, Night Sweats, Weight Gain and Weight Loss. Skin Not Present- Change in Wart/Mole, Dryness, Hives, Jaundice, New Lesions, Non-Healing Wounds, Rash and Ulcer. HEENT Present- Seasonal Allergies. Not Present- Earache, Hearing Loss, Hoarseness, Nose Bleed, Oral Ulcers, Ringing in the Ears, Sinus Pain, Sore Throat, Visual Disturbances, Wears glasses/contact lenses and Yellow Eyes. Respiratory Present- Wheezing. Not Present- Bloody sputum, Chronic Cough, Difficulty Breathing and Snoring. Breast Not Present- Breast Mass, Breast Pain, Nipple Discharge and Skin Changes. Cardiovascular Not Present- Chest Pain, Difficulty Breathing Lying Down, Leg Cramps, Palpitations, Rapid Heart Rate, Shortness of Breath and Swelling of Extremities. Gastrointestinal Present- Abdominal Pain, Bloating, Change in Bowel Habits, Excessive gas, Indigestion and Nausea. Not Present- Bloody Stool, Chronic diarrhea, Constipation, Difficulty Swallowing, Gets full quickly at meals, Hemorrhoids, Rectal Pain and Vomiting. Female Genitourinary Present- Pelvic Pain. Not Present- Frequency, Nocturia, Painful Urination and Urgency. Musculoskeletal Present- Back Pain, Joint Pain and Joint Stiffness. Not Present- Muscle Pain, Muscle Weakness and Swelling of Extremities. Neurological Present- Headaches, Numbness and Tingling. Not Present- Decreased Memory, Fainting, Seizures, Tremor, Trouble walking and Weakness. Psychiatric Not Present- Anxiety, Bipolar, Change in Sleep Pattern, Depression, Fearful and Frequent crying. Endocrine Present- Excessive Hunger and Hot flashes. Not Present- Cold Intolerance, Hair Changes, Heat Intolerance and New Diabetes. Hematology Not Present- Easy Bruising, Excessive bleeding, Gland problems, HIV and Persistent  Infections.   Vitals Kylie Curtis CMA; 11/28/2014 2:26 PM) 11/28/2014 2:25 PM Weight: 140 lb Height: 65in Body Surface Area: 1.71 m Body Mass Index: 23.3 kg/m Temp.: 98.22F(Oral)  Pulse: 86 (Regular)  Resp.: 17 (Unlabored)  BP: 130/70 (Sitting, Left Arm, Standard)    Physical Exam Kylie Ok MD; 11/28/2014 2:54 PM) General Mental Status-Alert.  General Appearance-Consistent with stated age. Hydration-Well hydrated. Voice-Normal.  Head and Neck Head-normocephalic, atraumatic with no lesions or palpable masses. Trachea-midline. Thyroid Gland Characteristics - normal size and consistency.  Chest and Lung Exam Chest and lung exam reveals -quiet, even and easy respiratory effort with no use of accessory muscles and on auscultation, normal breath sounds, no adventitious sounds and normal vocal resonance. Inspection Chest Wall - Normal. Back - normal.  Cardiovascular Cardiovascular examination reveals -normal heart sounds, regular rate and rhythm with no murmurs and normal pedal pulses bilaterally.  Abdomen Inspection Skin - Scar - no surgical scars. Hernias - Umbilical hernia - Incarcerated. Palpation/Percussion Normal exam - Soft, Non Tender, No Rebound tenderness, No Rigidity (guarding) and No hepatosplenomegaly. Auscultation Normal exam - Bowel sounds normal.    Assessment & Plan Kylie Ok MD; 02/28/8337 2:50 PM) UMBILICAL HERNIA WITHOUT OBSTRUCTION AND WITHOUT GANGRENE (553.1  K42.9) Impression: 34 year old female with a reducible umbilical hernia  1. The patient will like to proceed to the operating for a laparoscopic umbilical hernia repair with mesh. 2. All risks and benefits were discussed with the patient to generally include, but not limited to: infection, bleeding, damage to surrounding structures, acute and chronic nerve pain, and recurrence. Alternatives were offered and described. All questions were answered and the patient  voiced understanding of the procedure and wishes to proceed at this point with hernia repair.

## 2014-12-18 NOTE — Op Note (Signed)
12/18/2014  12:00 PM  PATIENT:  Kylie Curtis  34 y.o. female  PRE-OPERATIVE DIAGNOSIS:  UMBILICAL HERNIA  POST-OPERATIVE DIAGNOSIS:  7.5ZW UMBILICAL HERNIA  PROCEDURE:  Procedure(s): LAPAROSCOPIC UMBILICAL HERNIA REPAIR WITH MESH (N/A) INSERTION OF MESH (N/A)  SURGEON:  Surgeon(s) and Role:    * Ralene Ok, MD - Primary  ASSISTANTS: Jacqulyn Ducking, RN   ANESTHESIA:   local and general  EBL:     BLOOD ADMINISTERED:none  DRAINS: none   LOCAL MEDICATIONS USED:  BUPIVICAINE   SPECIMEN:  No Specimen  DISPOSITION OF SPECIMEN:  N/A  COUNTS:  YES  TOURNIQUET:  * No tourniquets in log *  DICTATION: .Dragon Dictation  Details of the procedure:   After the patient was consented patient was taken back to the operating room patient was then placed in supine position bilateral SCDs in place.  The patient was prepped and draped in the usual sterile fashion. After antibiotics were confirmed a timeout was called and all facts were verified. The Veress needle technique was used to insuflate the abdomen at Palmer's point. The abdomen was insufflated to 14 mm mercury. Subsequently a 5 mm trocar was placed a camera inserted there was no injury to any intra-abdominal organs.    There was seen to be an 0.5 cm  umbilical hernia.  A second camera port was in placed into the left lower quadrant.   At this the Falicform ligament was taken down with Bovie cautery maintaining hemostasis.   I proceeded to reduce the hernia contents.  Once the hernia was cleared away, a Parietex PCO 12cm  mesh was inserted into the abdomen.  The mesh was secured circumferentially with am Securestrap tacker in a double crown fashion. Mesh overlay was >5cm   The omentum was brought over the area of the mesh. The pneumoperitoneum was evacuated  & all trocars  were removed. The skin was reapproximated with 4-0  Monocryl sutures in a subcuticular fashion. The skin was dressed with Steri-Strips tape and gauze.  The  patient was taken to the recovery room in stable condition.   PLAN OF CARE: Discharge to home after PACU  PATIENT DISPOSITION:  PACU - hemodynamically stable.   Delay start of Pharmacological VTE agent (>24hrs) due to surgical blood loss or risk of bleeding: not applicable

## 2014-12-18 NOTE — Anesthesia Procedure Notes (Signed)
Procedure Name: Intubation Date/Time: 12/18/2014 11:28 AM Performed by: [place] Pink Pre-anesthesia Checklist: Patient identified, Emergency Drugs available, Suction available, Patient being monitored and Timeout performed Patient Re-evaluated:Patient Re-evaluated prior to inductionOxygen Delivery Method: Circle system utilized Preoxygenation: Pre-oxygenation with 100% oxygen Intubation Type: IV induction Ventilation: Mask ventilation without difficulty Laryngoscope Size: Mac and 3 Grade View: Grade I Tube type: Oral Tube size: 7.0 mm Number of attempts: 1 Airway Equipment and Method: Stylet and LTA kit utilized Placement Confirmation: ETT inserted through vocal cords under direct vision,  positive ETCO2 and breath sounds checked- equal and bilateral Secured at: 20 cm Tube secured with: Tape Dental Injury: Teeth and Oropharynx as per pre-operative assessment

## 2014-12-18 NOTE — Anesthesia Postprocedure Evaluation (Signed)
  Anesthesia Post-op Note  Patient: Kylie Curtis  Procedure(s) Performed: Procedure(s): LAPAROSCOPIC UMBILICAL HERNIA REPAIR WITH MESH (N/A) INSERTION OF MESH (N/A)  Patient Location: PACU  Anesthesia Type:General  Level of Consciousness: awake and alert   Airway and Oxygen Therapy: Patient Spontanous Breathing  Post-op Pain: moderate  Post-op Assessment: Post-op Vital signs reviewed, Patient's Cardiovascular Status Stable and Respiratory Function Stable  Post-op Vital Signs: Reviewed  Filed Vitals:   12/18/14 1230  BP: 126/75  Pulse: 62  Temp:   Resp: 17    Complications: No apparent anesthesia complications

## 2014-12-18 NOTE — Transfer of Care (Signed)
Immediate Anesthesia Transfer of Care Note  Patient: Kylie Curtis  Procedure(s) Performed: Procedure(s): LAPAROSCOPIC UMBILICAL HERNIA REPAIR WITH MESH (N/A) INSERTION OF MESH (N/A)  Patient Location: PACU  Anesthesia Type:General  Level of Consciousness: awake, alert  and oriented  Airway & Oxygen Therapy: Patient Spontanous Breathing and Patient connected to nasal cannula oxygen  Post-op Assessment: Report given to RN and Post -op Vital signs reviewed and stable  Post vital signs: Reviewed and stable  Last Vitals:  Filed Vitals:   12/18/14 1009  BP: 117/69  Pulse: 78  Temp: 36.4 C  Resp: 20    Complications: No apparent anesthesia complications

## 2014-12-18 NOTE — Anesthesia Preprocedure Evaluation (Addendum)
Anesthesia Evaluation  Patient identified by MRN, date of birth, ID band Patient awake    Reviewed: Allergy & Precautions, H&P , NPO status , Patient's Chart, lab work & pertinent test results  Airway Mallampati: I  TM Distance: >3 FB Neck ROM: Full    Dental no notable dental hx. (+) Teeth Intact, Dental Advisory Given   Pulmonary Current Smoker,  breath sounds clear to auscultation  Pulmonary exam normal       Cardiovascular negative cardio ROS  Rhythm:Regular Rate:Normal     Neuro/Psych  Headaches, Anxiety negative psych ROS   GI/Hepatic Neg liver ROS, GERD-  Medicated and Controlled,  Endo/Other  negative endocrine ROS  Renal/GU negative Renal ROS  negative genitourinary   Musculoskeletal   Abdominal   Peds  Hematology negative hematology ROS (+)   Anesthesia Other Findings   Reproductive/Obstetrics negative OB ROS                            Anesthesia Physical Anesthesia Plan  ASA: II  Anesthesia Plan: General   Post-op Pain Management:    Induction: Intravenous  Airway Management Planned: Oral ETT  Additional Equipment:   Intra-op Plan:   Post-operative Plan: Extubation in OR  Informed Consent: I have reviewed the patients History and Physical, chart, labs and discussed the procedure including the risks, benefits and alternatives for the proposed anesthesia with the patient or authorized representative who has indicated his/her understanding and acceptance.   Dental advisory given  Plan Discussed with: CRNA  Anesthesia Plan Comments:         Anesthesia Quick Evaluation

## 2014-12-18 NOTE — Progress Notes (Signed)
Report given to mark brande rn as caregiver 

## 2014-12-18 NOTE — Discharge Instructions (Signed)
[facility] _______[facility] ° °UMBILICAL HERNIA REPAIR: POST OP INSTRUCTIONS ° °Always review your discharge instruction sheet given to you by the facility where your surgery was performed. °IF YOU HAVE DISABILITY OR FAMILY LEAVE FORMS, YOU MUST BRING THEM TO THE OFFICE FOR PROCESSING.   °DO NOT GIVE THEM TO YOUR DOCTOR. ° °1. A  prescription for pain medication may be given to you upon discharge.  Take your pain medication as prescribed, if needed.  If narcotic pain medicine is not needed, then you may take acetaminophen (Tylenol) or ibuprofen (Advil) as needed. °2. Take your usually prescribed medications unless otherwise directed. °3. If you need a refill on your pain medication, please contact your pharmacy.  They will contact our office to request authorization. Prescriptions will not be filled after 5 pm or on week-ends. °4. You should follow a light diet the first 24 hours after arrival home, such as soup and crackers, etc.  Be sure to include lots of fluids daily.  Resume your normal diet the day after surgery. °5. Most patients will experience some swelling and bruising around the umbilicus or in the groin and scrotum.  Ice packs and reclining will help.  Swelling and bruising can take several days to resolve.  °6. It is common to experience some constipation if taking pain medication after surgery.  Increasing fluid intake and taking a stool softener (such as Colace) will usually help or prevent this problem from occurring.  A mild laxative (Milk of Magnesia or Miralax) should be taken according to package directions if there are no bowel movements after 48 hours. °7. Unless discharge instructions indicate otherwise, you may remove your bandages 24-48 hours after surgery, and you may shower at that time.  You may have steri-strips (small skin tapes) in place directly over the incision.  These strips should be left on the skin for 7-10 days.  If your surgeon used skin glue on the incision, you  may shower in 24 hours.  The glue will flake off over the next 2-3 weeks.  Any sutures or staples will be removed at the office during your follow-up visit. °8. ACTIVITIES:  You may resume regular (light) daily activities beginning the next day--such as daily self-care, walking, climbing stairs--gradually increasing activities as tolerated.  You may have sexual intercourse when it is comfortable.  Refrain from any heavy lifting or straining until approved by your doctor. °a. You may drive when you are no longer taking prescription pain medication, you can comfortably wear a seatbelt, and you can safely maneuver your car and apply brakes. °b. RETURN TO WORK:  __________________________________________________________ °9. You should see your doctor in the office for a follow-up appointment approximately 2-3 weeks after your surgery.  Make sure that you call for this appointment within a day or two after you arrive home to insure a convenient appointment time. °10. OTHER INSTRUCTIONS:  __________________________________________________________________________________________________________________________________________________________________________________________  °WHEN TO CALL YOUR DOCTOR: °1. Fever over 101.0 °2. Inability to urinate °3. Nausea and/or vomiting °4. Extreme swelling or bruising °5. Continued bleeding from incision. °6. Increased pain, redness, or drainage from the incision ° °The clinic staff is available to answer your questions during regular business hours.  Please don’t hesitate to call and ask to speak to one of the nurses for clinical concerns.  If you have a medical emergency, go to the nearest emergency room or call 911.  A surgeon from [facility] is always on call at the hospital ° ° °[address], [address], [place], [place]  [address] ? ° [address] [address], [place], [place]   [address] °[phone] ? [phone] ? FAX [phone] °Web site:  [email or web address] ° °

## 2014-12-18 NOTE — Interval H&P Note (Signed)
History and Physical Interval Note:  12/18/2014 7:40 AM  Kylie Curtis  has presented today for surgery, with the diagnosis of umbilical hernia  The various methods of treatment have been discussed with the patient and family. After consideration of risks, benefits and other options for treatment, the patient has consented to  Procedure(s): LAPAROSCOPIC UMBILICAL HERNIA repair with mesh (N/A) INSERTION OF MESH (N/A) as a surgical intervention .  The patient's history has been reviewed, patient examined, no change in status, stable for surgery.  I have reviewed the patient's chart and labs.  Questions were answered to the patient's satisfaction.     Rosario Jacks., Anne Hahn

## 2014-12-19 ENCOUNTER — Encounter (HOSPITAL_COMMUNITY): Payer: Self-pay | Admitting: General Surgery

## 2016-04-13 ENCOUNTER — Ambulatory Visit
Admission: EM | Admit: 2016-04-13 | Discharge: 2016-04-13 | Disposition: A | Discharge status: Home / Self Care | Payer: Medicaid Other | Attending: Family Medicine | Admitting: Family Medicine

## 2016-04-13 ENCOUNTER — Encounter: Payer: Self-pay | Admitting: Emergency Medicine

## 2016-04-13 DIAGNOSIS — M940 Chondrocostal junction syndrome [Tietze]: Secondary | ICD-10-CM | POA: Diagnosis not present

## 2016-04-13 MED ORDER — PREDNISONE 20 MG PO TABS: 20.0000 mg | ORAL_TABLET | Freq: Every day | ORAL | 0 refills | Status: DC

## 2016-04-13 NOTE — ED Triage Notes (Signed)
Patient c/o cough and chest congestion and right upper rib pain since October 9th.  Patient denies fevers or chills.

## 2016-04-13 NOTE — ED Provider Notes (Signed)
[facility]    CSN: [id number] Arrival date & time: 04/13/16  1235     History   Chief Complaint Chief Complaint  Patient presents with  . Cough    HPI Kylie Curtis is a 35 y.o. female.   35 yo female with a c/o right sided rib pain worse with movement, cough and deep breathing. States she's had a cold and cough for about 3 weeks. Denies any fevers, chills, shortness of breath. States cough has slowly improved, however not completely gone.    The history is provided by the patient.  Cough    Past Medical History:  Diagnosis Date  . Anxiety    Panic attacks  . Complication of anesthesia    took alot to get to sleep- with wisdom teeth  . Complication of anesthesia    woke up during hand surgery and could see them operating  . 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 Fibromyalgia   . GERD (gastroesophageal reflux disease)   . Headache    last one June 21,2016  . Hypoglycemia TMJ  . Pyelonephritis   . Shortness of breath dyspnea    with exertion    There are no active problems to display for this patient.   Past Surgical History:  Procedure Laterality Date  . INSERTION OF MESH N/A 12/18/2014   Procedure: INSERTION OF MESH;  Surgeon: Ralene Ok, MD;  Location: [place];  Service: General;  Laterality: N/A;  . LAPAROSCOPIC ABDOMINAL EXPLORATION    . right hand surgery Right    .Screws and graft- fracture  . TENDON REPAIR Right   . UMBILICAL HERNIA REPAIR N/A 12/18/2014   Procedure: LAPAROSCOPIC UMBILICAL HERNIA REPAIR WITH MESH;  Surgeon: Ralene Ok, MD;  Location: [place];  Service: General;  Laterality: N/A;  . WISDOM TOOTH EXTRACTION      OB History    No data available       Home Medications    Prior to Admission medications   Medication Sig Start Date End Date Taking? Authorizing Provider  omeprazole (PRILOSEC) 20 MG capsule Take 20 mg by mouth daily.   Yes Historical Provider, MD  JOLIVETTE 0.35 MG tablet Take 1 tablet by  mouth daily. 11/05/14   Historical Provider, MD  predniSONE (DELTASONE) 20 MG tablet Take 1 tablet (20 mg total) by mouth daily. 04/13/16   Norval Gable, MD    Family History History reviewed. No pertinent family history.  Social History Social History  Substance Use Topics  . Smoking status: Former Smoker    Packs/day: 0.25    Years: 17.00  . Smokeless tobacco: Never Used  . Alcohol use No     Allergies   Codeine; Morphine and related; and Tramadol   Review of Systems Review of Systems  Respiratory: Positive for cough.      Physical Exam Triage Vital Signs ED Triage Vitals  Enc Vitals Group     BP 04/13/16 1436 (!) 128/57     Pulse Rate 04/13/16 1436 79     Resp 04/13/16 1436 16     Temp 04/13/16 1436 98.2 F (36.8 C)     Temp Source 04/13/16 1436 Tympanic     SpO2 04/13/16 1436 100 %     Weight 04/13/16 1435 152 lb (68.9 kg)     Height 04/13/16 1435 5\' 5"  (1.651 m)     Head Circumference --      Peak Flow --  Pain Score 04/13/16 1437 6     Pain Loc --      Pain Edu? --      Excl. in [place]? --    No data found.   Updated Vital Signs BP (!) 128/57 (BP Location: Left Arm)   Pulse 79   Temp 98.2 F (36.8 C) (Tympanic)   Resp 16   Ht 5\' 5"  (1.651 m)   Wt 152 lb (68.9 kg)   LMP 03/23/2016 (Approximate)   SpO2 100%   BMI 25.29 kg/m   Visual Acuity Right Eye Distance:   Left Eye Distance:   Bilateral Distance:    Right Eye Near:   Left Eye Near:    Bilateral Near:     Physical Exam  Constitutional: She appears well-developed and well-nourished. No distress.  HENT:  Head: Normocephalic and atraumatic.  Right Ear: Tympanic membrane, external ear and ear canal normal.  Left Ear: Tympanic membrane, external ear and ear canal normal.  Nose: No mucosal edema, rhinorrhea, nose lacerations, sinus tenderness, nasal deformity, septal deviation or nasal septal hematoma. No epistaxis.  No foreign bodies. Right sinus exhibits no maxillary sinus tenderness  and no frontal sinus tenderness. Left sinus exhibits no maxillary sinus tenderness and no frontal sinus tenderness.  Mouth/Throat: Uvula is midline, oropharynx is clear and moist and mucous membranes are normal. No oropharyngeal exudate.  Eyes: Conjunctivae and EOM are normal. Pupils are equal, round, and reactive to light. Right eye exhibits no discharge. Left eye exhibits no discharge. No scleral icterus.  Neck: Normal range of motion. Neck supple. No thyromegaly present.  Cardiovascular: Normal rate, regular rhythm and normal heart sounds.   Pulmonary/Chest: Effort normal and breath sounds normal. No respiratory distress. She has no wheezes. She has no rales. She exhibits tenderness (right sided mid lateral chest wall tenderness to palpation).  Lymphadenopathy:    She has no cervical adenopathy.  Skin: She is not diaphoretic.  Nursing note and vitals reviewed.    UC Treatments / Results  Labs (all labs ordered are listed, but only abnormal results are displayed) Labs Reviewed - No data to display  EKG  EKG Interpretation None       Radiology No results found.  Procedures Procedures (including critical care time)  Medications Ordered in UC Medications - No data to display   Initial Impression / Assessment and Plan / UC Course  I have reviewed the triage vital signs and the nursing notes.  Pertinent labs & imaging results that were available during my care of the patient were reviewed by me and considered in my medical decision making (see chart for details).  Clinical Course      Final Clinical Impressions(s) / UC Diagnoses   Final diagnoses:  Costochondritis, acute    New Prescriptions Discharge Medication List as of 04/13/2016  3:07 PM    START taking these medications   Details  predniSONE (DELTASONE) 20 MG tablet Take 1 tablet (20 mg total) by mouth daily., Starting Mon 04/13/2016, Normal       1. diagnosis reviewed with patient 2. rx as per orders  above; reviewed possible side effects, interactions, risks and benefits  3. Recommend supportive treatment with otc acetaminophen, rest, ice 4. Follow-up prn if symptoms worsen or don't improve   Norval Gable, MD 04/13/16 1545

## 2016-05-01 ENCOUNTER — Encounter: Payer: Self-pay | Admitting: Emergency Medicine

## 2016-05-01 ENCOUNTER — Ambulatory Visit
Admission: EM | Admit: 2016-05-01 | Discharge: 2016-05-01 | Disposition: A | Discharge status: Home / Self Care | Payer: Medicaid Other | Attending: Family Medicine | Admitting: Family Medicine

## 2016-05-01 ENCOUNTER — Ambulatory Visit: Payer: Medicaid Other

## 2016-05-01 DIAGNOSIS — R0602 Shortness of breath: Secondary | ICD-10-CM | POA: Diagnosis not present

## 2016-05-01 DIAGNOSIS — F419 Anxiety disorder, unspecified: Secondary | ICD-10-CM | POA: Insufficient documentation

## 2016-05-01 DIAGNOSIS — M940 Chondrocostal junction syndrome [Tietze]: Secondary | ICD-10-CM | POA: Insufficient documentation

## 2016-05-01 DIAGNOSIS — K219 Gastro-esophageal reflux disease without esophagitis: Secondary | ICD-10-CM | POA: Insufficient documentation

## 2016-05-01 DIAGNOSIS — Z87891 Personal history of nicotine dependence: Secondary | ICD-10-CM | POA: Insufficient documentation

## 2016-05-01 DIAGNOSIS — M797 Fibromyalgia: Secondary | ICD-10-CM | POA: Diagnosis not present

## 2016-05-01 DIAGNOSIS — Z9889 Other specified postprocedural states: Secondary | ICD-10-CM | POA: Insufficient documentation

## 2016-05-01 DIAGNOSIS — R05 Cough: Secondary | ICD-10-CM | POA: Diagnosis not present

## 2016-05-01 DIAGNOSIS — Z79899 Other long term (current) drug therapy: Secondary | ICD-10-CM | POA: Insufficient documentation

## 2016-05-01 DIAGNOSIS — Z888 Allergy status to other drugs, medicaments and biological substances status: Secondary | ICD-10-CM | POA: Diagnosis not present

## 2016-05-01 MED ORDER — NAPROXEN 500 MG PO TABS: 500.0000 mg | ORAL_TABLET | Freq: Two times a day (BID) | ORAL | 0 refills | Status: DC

## 2016-05-01 NOTE — ED Provider Notes (Signed)
CSN: [id number]     Arrival date & time 05/01/16  1006 History   First MD Initiated Contact with Patient 05/01/16 1142     Chief Complaint  Patient presents with  . Chest Pain  . Cough   (Consider location/radiation/quality/duration/timing/severity/associated sxs/prior Treatment) HPI Patient is a 35 y.o. female who presents with right side midaxilla midrib pain.  Was seen by Dr. Zenda Alpers on 10/23 for this pain and started on Prednisone 20 mg x 7 days for costochondritis thought to be caused by a dry cough which had been present for 3 weeks.  Pain improved with the prednisone but has returned since she completed the prednisone. Pain is still better than when first presented. Dry cough is still present but improving. States it is uncomfortable to breath but has no SOB.  Pain is a constant 3/10 until she coughs when it is increased to a 8/10 and even worse with sneezing.  Pain is also exacerbated when laying on her right side and with certain motion, particularly left thoracic rotation. Denies heavy lifting but has been doing normal house work. Denies any known injury. Denies fever, chills, N/V/D. Past Medical History:  Diagnosis Date  . Anxiety    Panic attacks  . Complication of anesthesia    took alot to get to sleep- with wisdom teeth  . Complication of anesthesia    woke up during hand surgery and could see them operating  . 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 Fibromyalgia   . GERD (gastroesophageal reflux disease)   . Headache    last one June 21,2016  . Hypoglycemia TMJ  . Pyelonephritis   . Shortness of breath dyspnea    with exertion   Past Surgical History:  Procedure Laterality Date  . INSERTION OF MESH N/A 12/18/2014   Procedure: INSERTION OF MESH;  Surgeon: Ralene Ok, MD;  Location: [place];  Service: General;  Laterality: N/A;  . LAPAROSCOPIC ABDOMINAL EXPLORATION    . right hand surgery Right    .Screws and graft- fracture  . TENDON REPAIR Right   . UMBILICAL  HERNIA REPAIR N/A 12/18/2014   Procedure: LAPAROSCOPIC UMBILICAL HERNIA REPAIR WITH MESH;  Surgeon: Ralene Ok, MD;  Location: [place];  Service: General;  Laterality: N/A;  . WISDOM TOOTH EXTRACTION     History reviewed. No pertinent family history. Social History  Substance Use Topics  . Smoking status: Former Smoker    Packs/day: 0.25    Years: 17.00  . Smokeless tobacco: Never Used  . Alcohol use No   OB History    No data available     Review of Systems  Constitutional: Positive for activity change. Negative for chills, fatigue and fever.  Respiratory: Positive for cough. Negative for chest tightness and shortness of breath.   Musculoskeletal: Positive for arthralgias.  Skin: Negative for color change, rash and wound.  All other systems reviewed and are negative.   Allergies  Codeine; Morphine and related; and Tramadol  Home Medications   Prior to Admission medications   Medication Sig Start Date End Date Taking? Authorizing Provider  JOLIVETTE 0.35 MG tablet Take 1 tablet by mouth daily. 11/05/14   Historical Provider, MD  naproxen (NAPROSYN) 500 MG tablet Take 1 tablet (500 mg total) by mouth 2 (two) times daily with a meal. 05/01/16   Lorin Picket, PA-C  omeprazole (PRILOSEC) 20 MG capsule Take 20 mg by mouth daily.    Historical Provider, MD   Meds Ordered  and Administered this Visit  Medications - No data to display  BP 118/70 (BP Location: Left Arm)   Pulse 86   Temp 97.2 F (36.2 C) (Tympanic)   Resp 16   Ht 5\' 5"  (1.651 m)   Wt 152 lb (68.9 kg)   LMP 04/17/2016 (Within Days) Comment: denies preg  SpO2 100%   BMI 25.29 kg/m  No data found.   Physical Exam  Constitutional: She is oriented to person, place, and time. She appears well-developed and well-nourished. No distress.  HENT:  Head: Normocephalic and atraumatic.  Eyes: EOM are normal.  Neck: Normal range of motion.  Cardiovascular: Normal rate and regular rhythm.   Pulmonary/Chest:  Effort normal and breath sounds normal. No respiratory distress. She has no wheezes. She has no rales. She exhibits tenderness.  No crepitus or induration. Thoracic rotation is uncomfortable when rotating to the left. Lateral flexion to the left also increases pain. Point tenderness to midaxial area of right mid-rib cage.  Musculoskeletal: Normal range of motion.  Neurological: She is alert and oriented to person, place, and time.  Skin: Skin is warm and dry. No rash noted. She is not diaphoretic. No erythema.  Psychiatric: She has a normal mood and affect. Her behavior is normal. Judgment and thought content normal.  Nursing note and vitals reviewed.   Urgent Care Course   Clinical Course     Procedures (including critical care time)  Labs Review Labs Reviewed - No data to display  Imaging Review Dg Ribs Unilateral W/chest Right  Result Date: 05/01/2016 CLINICAL DATA:  35 year old female with a history of posterior lateral rib pain for 3 weeks. EXAM: RIGHT RIBS AND CHEST - 3+ VIEW COMPARISON:  None. FINDINGS: Cardiomediastinal silhouette within normal limits. Mild scoliotic curvature of the upper thoracic spine, apex left centered at T4. No confluent airspace disease, pneumothorax, or pleural effusion. No displaced rib fracture identified. IMPRESSION: No evidence of acute cardiopulmonary disease. No displaced rib fracture. Mild scoliotic curvature of the upper thoracic spine with apex left curvature at T4. Signed, Dulcy Fanny. Earleen Newport, DO [facility] Electronically Signed   By: Corrie Mckusick D.O.   On: 05/01/2016 12:22     MDM   1. Costochondritis     Discharge Medication List as of 05/01/2016 12:37 PM    START taking these medications   Details  naproxen (NAPROSYN) 500 MG tablet Take 1 tablet (500 mg total) by mouth 2 (two) times daily with a meal., Starting Fri 05/01/2016, Normal       Plan: 1. Test/x-ray results and  diagnosis reviewed with patient 2. rx as per orders; risks, benefits, potential side effects reviewed with patient 3. Recommend supportive treatment with Symptom avoidance and rest as necessary. Patient was reassured that this may take a six-day weeks for complete healing. Will start her on Naprosyn today for pain control and anti-inflammatory effect. Also advised her to consider taking Prevacid or Prilosec for gastric protective measures. Is not improving she should follow-up in our clinic 4. F/u prn if symptoms worsen or don't improve :     Lorin Picket, PA-C 05/01/16 Harrington Roemer, PA-C 05/01/16 1346

## 2016-05-01 NOTE — ED Triage Notes (Signed)
Patient states that she was seen 2 weeks ago for right sided chest pain and cough and that it did get a little better but has not completely resolved.  Patient denies SOB.

## 2016-06-09 IMAGING — CT CT ABD-PELV W/ CM
2 of 4 series · 16 of 46 positions shown, 18 images · IV contrast (APPLIED)
Comparison: None.

CLINICAL DATA: Increasing abdominal pain with nausea since
yesterday. History of umbilical hernia since 4124. Initial
encounter.

EXAM:
CT ABDOMEN AND PELVIS WITH CONTRAST
TECHNIQUE: Multidetector CT imaging of the abdomen and pelvis was performed
using the standard protocol following bolus administration of
intravenous contrast.
CONTRAST:  100mL OMNIPAQUE IOHEXOL 300 MG/ML SOLN, 50mL OMNIPAQUE
IOHEXOL 300 MG/ML SOLN

[Series 2: abd/pelvis 5.0 b31f · axial · 0.76mm/px · z∈[-471,-71]mm · 13 of 88 slices shown, 15 images]
[im 4/88  soft-tissue]
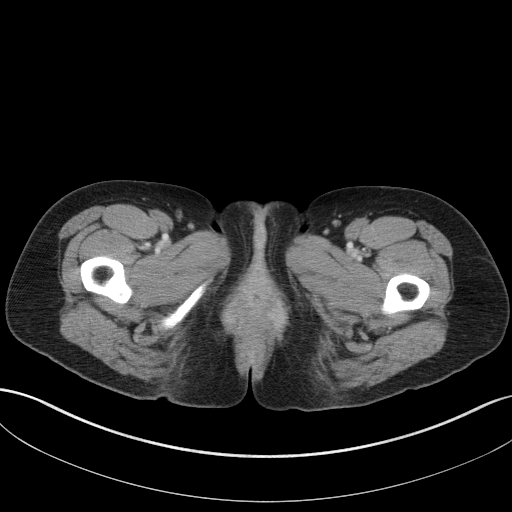
[im 4/88  bone]
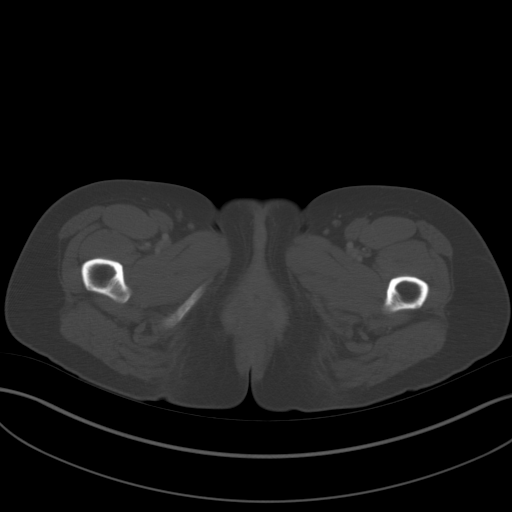
[im 11/88  soft-tissue]
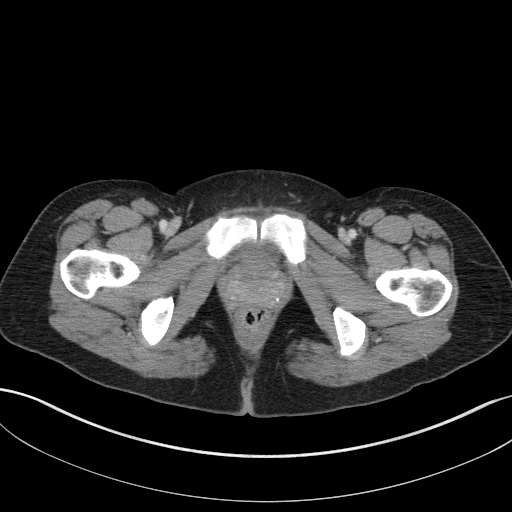
[im 17/88  soft-tissue]
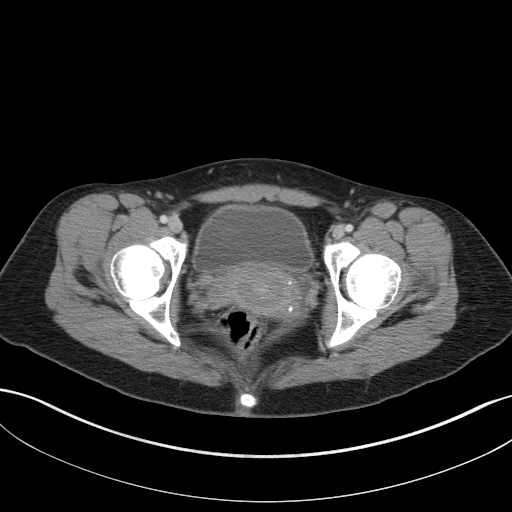
[im 24/88  soft-tissue]
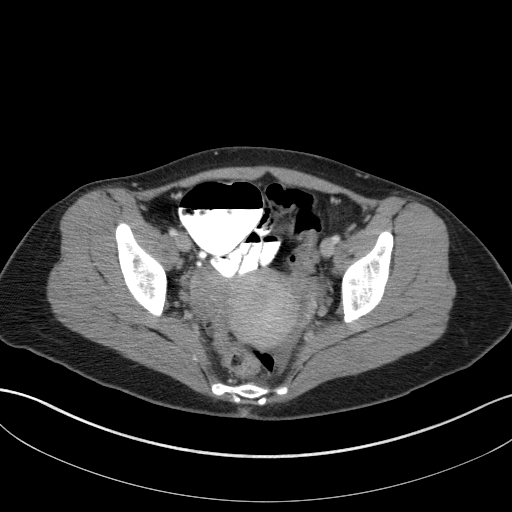
[im 31/88  soft-tissue]
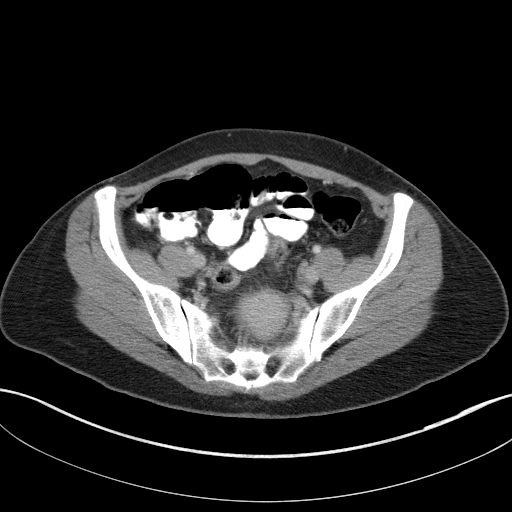
[im 37/88  soft-tissue]
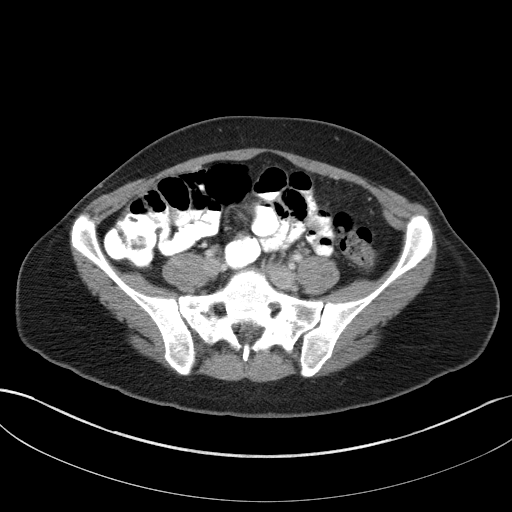
[im 44/88  soft-tissue]
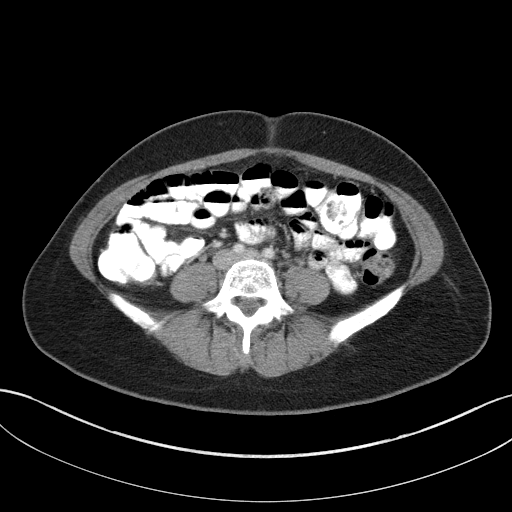
[im 51/88  soft-tissue]
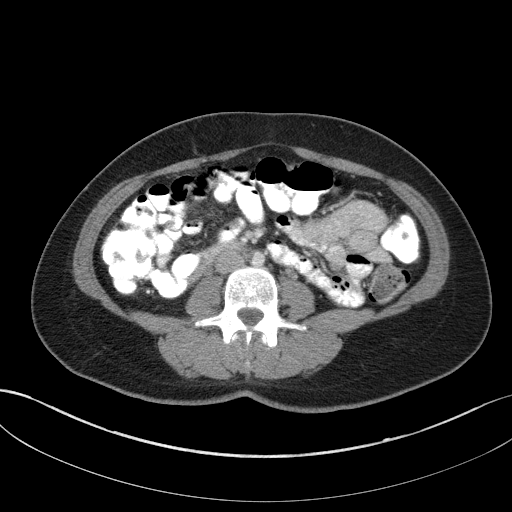
[im 57/88  soft-tissue]
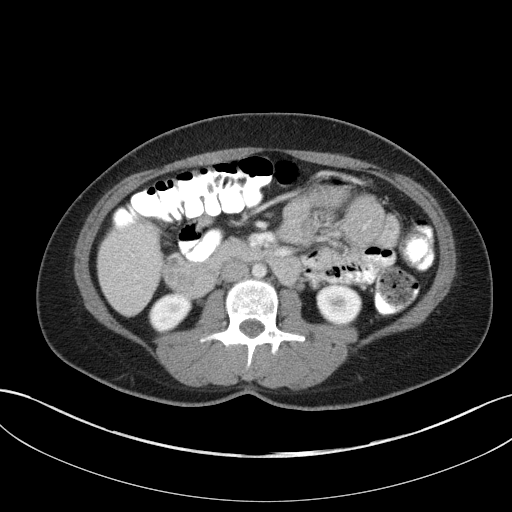
[im 57/88  bone]
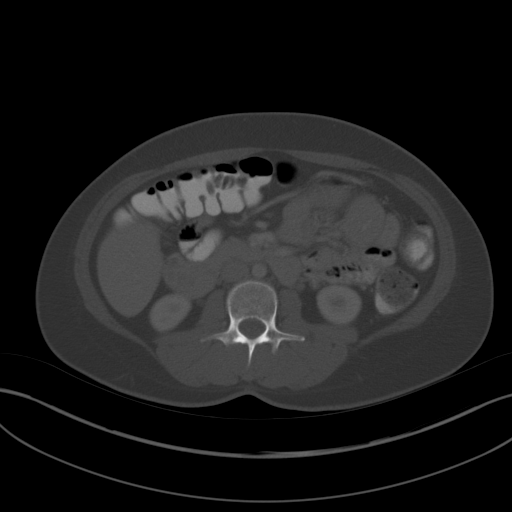
[im 64/88  soft-tissue]
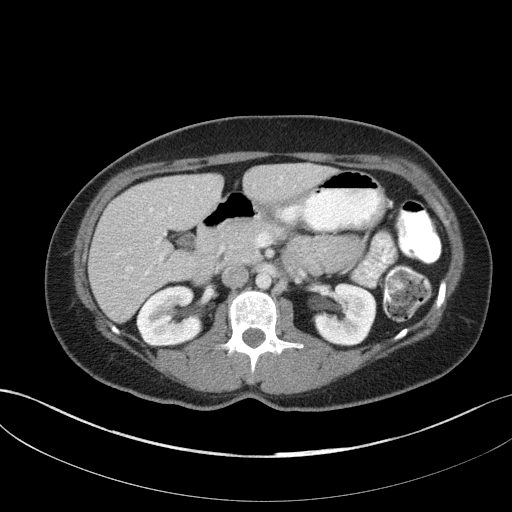
[im 71/88  soft-tissue]
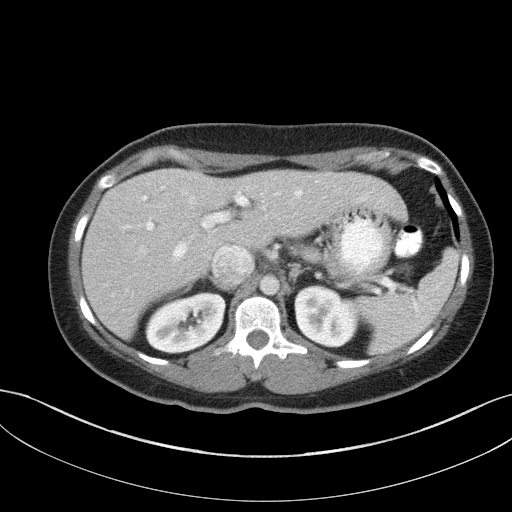
[im 77/88  soft-tissue]
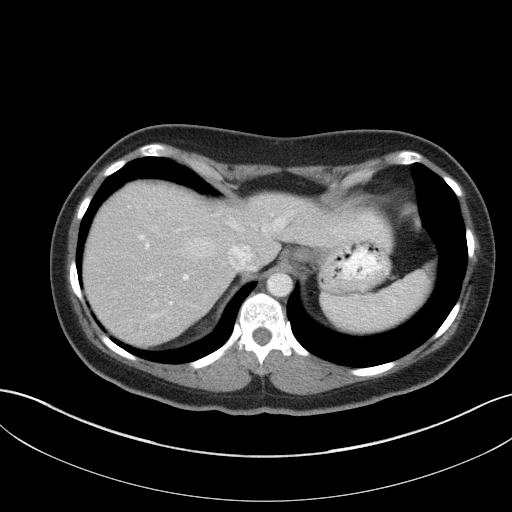
[im 84/88  soft-tissue]
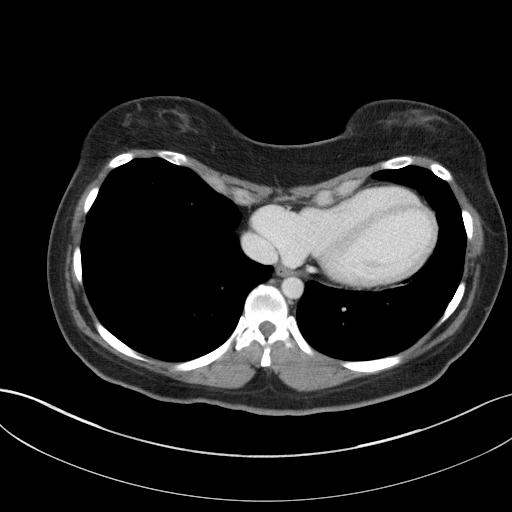

[Series 5: abd/pelvis 3.0 coronal · coronal · 0.82mm/px · 3 of 76 slices shown]
[im 26/76  soft-tissue]
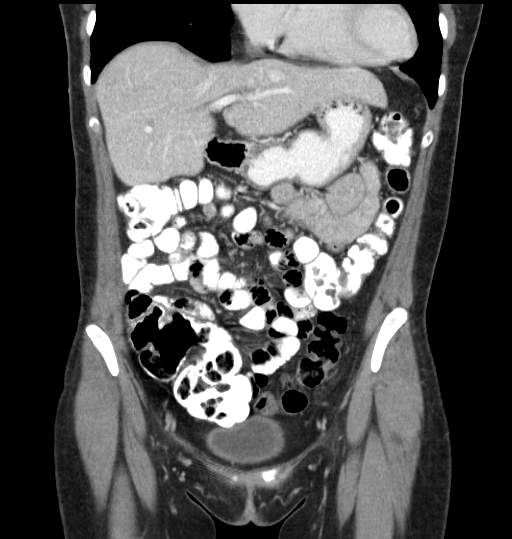
[im 34/76  soft-tissue]
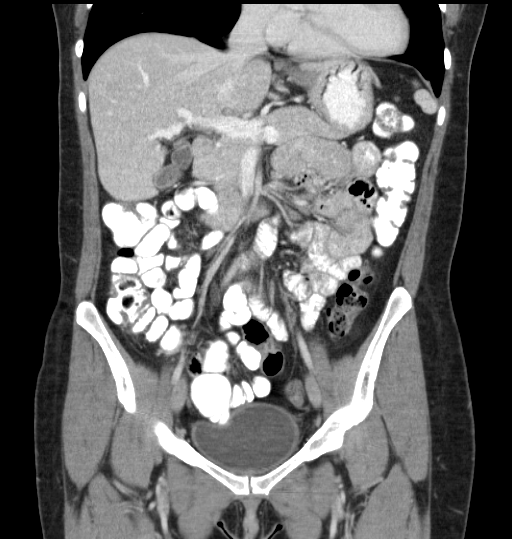
[im 42/76  soft-tissue]
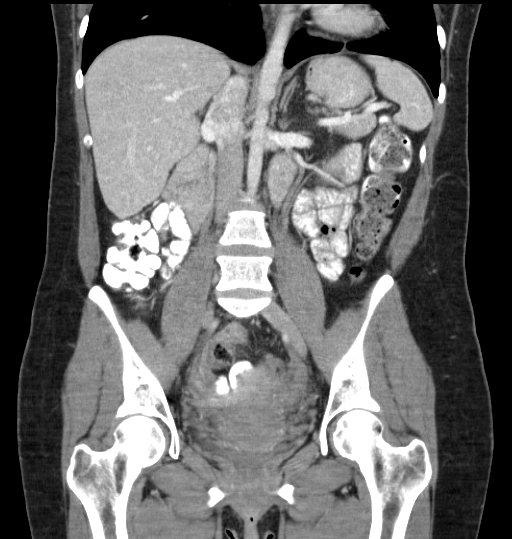

[16 of 46 positions shown; findings below may reference images not displayed]

FINDINGS: Lower chest: Clear lung bases. No significant pleural or pericardial
effusion. There is a moderate pectus deformity of the sternum with
resulting mild flattening of the heart.

Hepatobiliary: 5 mm low-density lesion in the dome of the right
hepatic lobe on image number 10 is likely a small cyst. The liver
otherwise appears unremarkable. No evidence of gallstones,
gallbladder wall thickening or biliary dilatation.

Pancreas: Unremarkable. No pancreatic ductal dilatation or
surrounding inflammatory changes.

Spleen: Normal in size without focal abnormality.

Adrenals/Urinary Tract: Both adrenal glands appear normal.The
kidneys appear normal without evidence of urinary tract calculus,
suspicious lesion or hydronephrosis. No bladder abnormalities are
seen.

Stomach/Bowel: No evidence of bowel wall thickening, distention or
surrounding inflammatory change.The appendix appears normal.

Vascular/Lymphatic: There are no enlarged abdominal or pelvic lymph
nodes. Early right iliac artery atherosclerosis noted.

Reproductive: The uterus is retroverted with mild myometrial
heterogeneity. No evidence of adnexal mass. Multiple pelvic
phleboliths.

Other: Small umbilical hernia containing only fat. The neck of the
hernia measures 11 mm on sagittal image 62. Herniated fat measures
up to 2.2 cm. There is no evidence of herniated bowel or
incarceration.

Musculoskeletal: No acute or significant osseous findings.
IMPRESSION: 1. Small umbilical hernia containing only fat. No signs of
incarceration.
2. No apparent acute findings.  The appendix appears normal.
3. Probable small cyst in the dome of the right hepatic lobe.
4. Moderate pectus deformity of the sternum.

## 2016-07-17 ENCOUNTER — Ambulatory Visit
Admission: EM | Admit: 2016-07-17 | Discharge: 2016-07-17 | Disposition: A | Discharge status: Home / Self Care | Payer: Medicaid Other | Attending: Family Medicine | Admitting: Family Medicine

## 2016-07-17 ENCOUNTER — Encounter: Payer: Self-pay | Admitting: Emergency Medicine

## 2016-07-17 DIAGNOSIS — F419 Anxiety disorder, unspecified: Secondary | ICD-10-CM | POA: Diagnosis not present

## 2016-07-17 DIAGNOSIS — Z87891 Personal history of nicotine dependence: Secondary | ICD-10-CM | POA: Diagnosis not present

## 2016-07-17 DIAGNOSIS — J029 Acute pharyngitis, unspecified: Secondary | ICD-10-CM | POA: Diagnosis present

## 2016-07-17 DIAGNOSIS — B9789 Other viral agents as the cause of diseases classified elsewhere: Secondary | ICD-10-CM | POA: Insufficient documentation

## 2016-07-17 DIAGNOSIS — J028 Acute pharyngitis due to other specified organisms: Secondary | ICD-10-CM | POA: Diagnosis not present

## 2016-07-17 DIAGNOSIS — M940 Chondrocostal junction syndrome [Tietze]: Secondary | ICD-10-CM | POA: Insufficient documentation

## 2016-07-17 DIAGNOSIS — M797 Fibromyalgia: Secondary | ICD-10-CM | POA: Insufficient documentation

## 2016-07-17 DIAGNOSIS — Z79899 Other long term (current) drug therapy: Secondary | ICD-10-CM | POA: Diagnosis not present

## 2016-07-17 DIAGNOSIS — Z888 Allergy status to other drugs, medicaments and biological substances status: Secondary | ICD-10-CM | POA: Insufficient documentation

## 2016-07-17 DIAGNOSIS — K219 Gastro-esophageal reflux disease without esophagitis: Secondary | ICD-10-CM | POA: Insufficient documentation

## 2016-07-17 DIAGNOSIS — Z9889 Other specified postprocedural states: Secondary | ICD-10-CM | POA: Insufficient documentation

## 2016-07-17 LAB — RAPID STREP SCREEN (MED CTR MEBANE ONLY): STREPTOCOCCUS, GROUP A SCREEN (DIRECT): NEGATIVE

## 2016-07-17 MED ORDER — PREDNISONE 20 MG PO TABS: 20.0000 mg | ORAL_TABLET | Freq: Every day | ORAL | 0 refills | Status: DC

## 2016-07-17 NOTE — ED Provider Notes (Signed)
[facility]    CSN: [id number] Arrival date & time: 07/17/16  0946     History   Chief Complaint Chief Complaint  Patient presents with  . Sore Throat  . Otalgia    HPI Kylie Curtis is a 36 y.o. female.   Patient also complains of continued intermittent right sided chest wall muscle aches and pains. Has a h/o costochondritis treated in the past.    The history is provided by the patient.  Sore Throat  Pertinent negatives include no headaches.  Otalgia  Associated symptoms: sore throat   Associated symptoms: no headaches   URI  Presenting symptoms: ear pain and sore throat   Severity:  Moderate Onset quality:  Sudden Duration:  2 days Timing:  Constant Chronicity:  New Relieved by:  None tried Ineffective treatments:  None tried Associated symptoms: no headaches, no sinus pain and no wheezing   Risk factors: sick contacts   Risk factors: not elderly, no chronic cardiac disease, no chronic kidney disease, no chronic respiratory disease, no diabetes mellitus, no immunosuppression, no recent illness and no recent travel     Past Medical History:  Diagnosis Date  . Anxiety    Panic attacks  . Complication of anesthesia    took alot to get to sleep- with wisdom teeth  . Complication of anesthesia    woke up during hand surgery and could see them operating  . 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 Fibromyalgia   . GERD (gastroesophageal reflux disease)   . Headache    last one June 21,2016  . Hypoglycemia TMJ  . Pyelonephritis   . Shortness of breath dyspnea    with exertion    There are no active problems to display for this patient.   Past Surgical History:  Procedure Laterality Date  . INSERTION OF MESH N/A 12/18/2014   Procedure: INSERTION OF MESH;  Surgeon: Ralene Ok, MD;  Location: [place];  Service: General;  Laterality: N/A;  . LAPAROSCOPIC ABDOMINAL EXPLORATION    . right hand surgery Right    .Screws and graft- fracture  .  TENDON REPAIR Right   . UMBILICAL HERNIA REPAIR N/A 12/18/2014   Procedure: LAPAROSCOPIC UMBILICAL HERNIA REPAIR WITH MESH;  Surgeon: Ralene Ok, MD;  Location: [place];  Service: General;  Laterality: N/A;  . WISDOM TOOTH EXTRACTION      OB History    No data available       Home Medications    Prior to Admission medications   Medication Sig Start Date End Date Taking? Authorizing Provider  JOLIVETTE 0.35 MG tablet Take 1 tablet by mouth daily. 11/05/14   Historical Provider, MD  naproxen (NAPROSYN) 500 MG tablet Take 1 tablet (500 mg total) by mouth 2 (two) times daily with a meal. 05/01/16   Lorin Picket, PA-C  omeprazole (PRILOSEC) 20 MG capsule Take 20 mg by mouth daily.    Historical Provider, MD  predniSONE (DELTASONE) 20 MG tablet Take 1 tablet (20 mg total) by mouth daily. 07/17/16   Norval Gable, MD    Family History History reviewed. No pertinent family history.  Social History Social History  Substance Use Topics  . Smoking status: Former Smoker    Packs/day: 0.25    Years: 17.00  . Smokeless tobacco: Never Used  . Alcohol use No     Allergies   Codeine; Morphine and related; and Tramadol   Review of Systems Review of Systems  HENT:  Positive for ear pain and sore throat. Negative for sinus pain.   Respiratory: Negative for wheezing.   Neurological: Negative for headaches.     Physical Exam Triage Vital Signs ED Triage Vitals  Enc Vitals Group     BP 07/17/16 1014 112/72     Pulse Rate 07/17/16 1014 83     Resp 07/17/16 1014 16     Temp 07/17/16 1014 98.3 F (36.8 C)     Temp Source 07/17/16 1014 Oral     SpO2 07/17/16 1014 100 %     Weight 07/17/16 1013 150 lb (68 kg)     Height 07/17/16 1013 5\' 5"  (1.651 m)     Head Circumference --      Peak Flow --      Pain Score 07/17/16 1015 4     Pain Loc --      Pain Edu? --      Excl. in [place]? --    No data found.   Updated Vital Signs BP 112/72 (BP Location: Right Arm)   Pulse 83    Temp 98.3 F (36.8 C) (Oral)   Resp 16   Ht 5\' 5"  (1.651 m)   Wt 150 lb (68 kg)   LMP 07/11/2016 (Exact Date)   SpO2 100%   BMI 24.96 kg/m   Visual Acuity Right Eye Distance:   Left Eye Distance:   Bilateral Distance:    Right Eye Near:   Left Eye Near:    Bilateral Near:     Physical Exam  Constitutional: She appears well-developed and well-nourished. No distress.  HENT:  Head: Normocephalic and atraumatic.  Right Ear: Tympanic membrane, external ear and ear canal normal.  Left Ear: Tympanic membrane, external ear and ear canal normal.  Nose: Mucosal edema and rhinorrhea present. No nose lacerations, sinus tenderness, nasal deformity, septal deviation or nasal septal hematoma. No epistaxis.  No foreign bodies. Right sinus exhibits no maxillary sinus tenderness and no frontal sinus tenderness. Left sinus exhibits no maxillary sinus tenderness and no frontal sinus tenderness.  Mouth/Throat: Uvula is midline, oropharynx is clear and moist and mucous membranes are normal. No oropharyngeal exudate.  Eyes: Conjunctivae and EOM are normal. Pupils are equal, round, and reactive to light. Right eye exhibits no discharge. Left eye exhibits no discharge. No scleral icterus.  Neck: Normal range of motion. Neck supple. No thyromegaly present.  Cardiovascular: Normal rate, regular rhythm and normal heart sounds.   Pulmonary/Chest: Effort normal and breath sounds normal. No respiratory distress. She has no wheezes. She has no rales. She exhibits tenderness (reproducible).  Lymphadenopathy:    She has no cervical adenopathy.  Skin: She is not diaphoretic.  Nursing note and vitals reviewed.    UC Treatments / Results  Labs (all labs ordered are listed, but only abnormal results are displayed) Labs Reviewed  RAPID STREP SCREEN (NOT AT [facility])  CULTURE, GROUP A STREP [facility])    EKG  EKG Interpretation None       Radiology No results found.  Procedures Procedures (including  critical care time)  Medications Ordered in UC Medications - No data to display   Initial Impression / Assessment and Plan / UC Course  I have reviewed the triage vital signs and the nursing notes.  Pertinent labs & imaging results that were available during my care of the patient were reviewed by me and considered in my medical decision making (see chart for details).       Final Clinical Impressions(s) /  UC Diagnoses   Final diagnoses:  Acute viral pharyngitis  Costochondritis    New Prescriptions Discharge Medication List as of 07/17/2016 10:44 AM    START taking these medications   Details  predniSONE (DELTASONE) 20 MG tablet Take 1 tablet (20 mg total) by mouth daily., Starting Fri 07/17/2016, Normal      1. Labs results and diagnosis reviewed with patient 2. rx as per orders above; reviewed possible side effects, interactions, risks and benefits  3. Recommend supportive treatment with otc analgesics prn 4. Follow-up prn if symptoms worsen or don't improve   Norval Gable, MD 07/17/16 1202

## 2016-07-17 NOTE — ED Triage Notes (Signed)
Patient c/o sore throat and right ear pain that started Wed.  Patient denies fevers.

## 2016-07-20 LAB — CULTURE, GROUP A STREP (THRC)

## 2016-08-27 ENCOUNTER — Ambulatory Visit
Admission: EM | Admit: 2016-08-27 | Discharge: 2016-08-27 | Disposition: A | Discharge status: Home / Self Care | Payer: Medicaid Other | Attending: Family Medicine | Admitting: Family Medicine

## 2016-08-27 ENCOUNTER — Encounter: Payer: Self-pay | Admitting: *Deleted

## 2016-08-27 ENCOUNTER — Ambulatory Visit: Payer: Medicaid Other

## 2016-08-27 DIAGNOSIS — K219 Gastro-esophageal reflux disease without esophagitis: Secondary | ICD-10-CM | POA: Insufficient documentation

## 2016-08-27 DIAGNOSIS — F419 Anxiety disorder, unspecified: Secondary | ICD-10-CM | POA: Diagnosis not present

## 2016-08-27 DIAGNOSIS — M7742 Metatarsalgia, left foot: Secondary | ICD-10-CM | POA: Diagnosis not present

## 2016-08-27 DIAGNOSIS — Z87891 Personal history of nicotine dependence: Secondary | ICD-10-CM | POA: Diagnosis not present

## 2016-08-27 DIAGNOSIS — M79672 Pain in left foot: Secondary | ICD-10-CM | POA: Insufficient documentation

## 2016-08-27 DIAGNOSIS — Z9889 Other specified postprocedural states: Secondary | ICD-10-CM | POA: Diagnosis not present

## 2016-08-27 DIAGNOSIS — Z888 Allergy status to other drugs, medicaments and biological substances status: Secondary | ICD-10-CM | POA: Insufficient documentation

## 2016-08-27 DIAGNOSIS — X58XXXA Exposure to other specified factors, initial encounter: Secondary | ICD-10-CM | POA: Insufficient documentation

## 2016-08-27 DIAGNOSIS — M797 Fibromyalgia: Secondary | ICD-10-CM | POA: Insufficient documentation

## 2016-08-27 DIAGNOSIS — Z79899 Other long term (current) drug therapy: Secondary | ICD-10-CM | POA: Diagnosis not present

## 2016-08-27 MED ORDER — NAPROXEN 500 MG PO TABS: 500.0000 mg | ORAL_TABLET | Freq: Two times a day (BID) | ORAL | 0 refills | Status: DC

## 2016-08-27 NOTE — ED Provider Notes (Signed)
CSN: [id number]     Arrival date & time 08/27/16  1444 History   First MD Initiated Contact with Patient 08/27/16 1608     Chief Complaint  Patient presents with  . Foot Pain   (Consider location/radiation/quality/duration/timing/severity/associated sxs/prior Treatment) HPI  This is a 36 year old female who is known to our practice in approximately a week and a half ago felt a pop in her left foot causing pain to shoot up her leg. Most of her pain is localized over the forefoot feels it in her heel on occasion. Is walking with a limp mostly putting weight on the outside of her foot. She's had no ecchymosis or swelling.       Past Medical History:  Diagnosis Date  . Anxiety    Panic attacks  . Complication of anesthesia    took alot to get to sleep- with wisdom teeth  . Complication of anesthesia    woke up during hand surgery and could see them operating  . 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 Fibromyalgia   . GERD (gastroesophageal reflux disease)   . Headache    last one June 21,2016  . Hypoglycemia TMJ  . Pyelonephritis   . Shortness of breath dyspnea    with exertion   Past Surgical History:  Procedure Laterality Date  . INSERTION OF MESH N/A 12/18/2014   Procedure: INSERTION OF MESH;  Surgeon: Ralene Ok, MD;  Location: [place];  Service: General;  Laterality: N/A;  . LAPAROSCOPIC ABDOMINAL EXPLORATION    . right hand surgery Right    .Screws and graft- fracture  . TENDON REPAIR Right   . UMBILICAL HERNIA REPAIR N/A 12/18/2014   Procedure: LAPAROSCOPIC UMBILICAL HERNIA REPAIR WITH MESH;  Surgeon: Ralene Ok, MD;  Location: [place];  Service: General;  Laterality: N/A;  . WISDOM TOOTH EXTRACTION     History reviewed. No pertinent family history. Social History  Substance Use Topics  . Smoking status: Former Smoker    Packs/day: 0.25    Years: 17.00  . Smokeless tobacco: Never Used  . Alcohol use No   OB History    No data available     Review of  Systems  Constitutional: Positive for activity change. Negative for chills, fatigue and fever.  Musculoskeletal: Positive for arthralgias, gait problem and myalgias.  All other systems reviewed and are negative.   Allergies  Codeine; Morphine and related; and Tramadol  Home Medications   Prior to Admission medications   Medication Sig Start Date End Date Taking? Authorizing Provider  JOLIVETTE 0.35 MG tablet Take 1 tablet by mouth daily. 11/05/14   Historical Provider, MD  naproxen (NAPROSYN) 500 MG tablet Take 1 tablet (500 mg total) by mouth 2 (two) times daily with a meal. 08/27/16   Lorin Picket, PA-C  omeprazole (PRILOSEC) 20 MG capsule Take 20 mg by mouth daily.    Historical Provider, MD  predniSONE (DELTASONE) 20 MG tablet Take 1 tablet (20 mg total) by mouth daily. 07/17/16   Norval Gable, MD   Meds Ordered and Administered this Visit  Medications - No data to display  BP 127/82 (BP Location: Left Arm)   Pulse 81   Temp 98.6 F (37 C) (Oral)   Resp 16   Wt 158 lb (71.7 kg)   LMP 08/08/2016   SpO2 100%   BMI 26.29 kg/m  No data found.   Physical Exam  Urgent Care Course     Procedures (including critical  care time)  Labs Review Labs Reviewed - No data to display  Imaging Review Dg Foot Complete Left  Result Date: 08/27/2016 CLINICAL DATA:  The patient felt a pop in the left foot 1 week ago with onset of pain. Initial encounter. EXAM: LEFT FOOT - COMPLETE 3+ VIEW COMPARISON:  None. FINDINGS: There is no evidence of fracture or dislocation. There is no evidence of arthropathy or other focal bone abnormality. Soft tissues are unremarkable. IMPRESSION: Negative exam. Electronically Signed   By: Inge Rise M.D.   On: 08/27/2016 16:33     Visual Acuity Review  Right Eye Distance:   Left Eye Distance:   Bilateral Distance:    Right Eye Near:   Left Eye Near:    Bilateral Near:     Patient was fitted with a left foot boot orthosis    MDM   1.  Metatarsalgia of left foot    Discharge Medication List as of 08/27/2016  5:02 PM    Naprosyn 500 mg twice a day with food   Plan: 1. Test/x-ray results and diagnosis reviewed with patient 2. rx as per orders; risks, benefits, potential side effects reviewed with patient 3. Recommend supportive treatment with elevation and ice for comfort. Use boot orthosis for all activities requiring standing or walking. May remove it at home during quiet times. Use for 2 weeks and begin weaning as tolerated if not improving follow-up with podiatrist    Lorin Picket, PA-C 08/27/16 1723

## 2016-08-27 NOTE — ED Triage Notes (Signed)
Patient was at work 1.5 weeks ago when she felt her left foot pop causing pain to shoot up her left leg. Foot pain is located at ball and heel.

## 2016-08-27 NOTE — Discharge Instructions (Signed)
Use the boot for activities requiring walking or standing. May remove boot at home when you're quiet. Use ice for 20 minutes 2-3 times daily. If you are not improving in 2-3 weeks follow-up with her podiatrist

## 2016-10-08 ENCOUNTER — Ambulatory Visit
Admission: EM | Admit: 2016-10-08 | Discharge: 2016-10-08 | Disposition: A | Discharge status: Home / Self Care | Payer: Medicaid Other | Attending: Family Medicine | Admitting: Family Medicine

## 2016-10-08 DIAGNOSIS — K219 Gastro-esophageal reflux disease without esophagitis: Secondary | ICD-10-CM | POA: Insufficient documentation

## 2016-10-08 DIAGNOSIS — J069 Acute upper respiratory infection, unspecified: Secondary | ICD-10-CM | POA: Diagnosis not present

## 2016-10-08 DIAGNOSIS — Z885 Allergy status to narcotic agent status: Secondary | ICD-10-CM | POA: Diagnosis not present

## 2016-10-08 DIAGNOSIS — H6693 Otitis media, unspecified, bilateral: Secondary | ICD-10-CM | POA: Insufficient documentation

## 2016-10-08 DIAGNOSIS — Z87891 Personal history of nicotine dependence: Secondary | ICD-10-CM | POA: Diagnosis not present

## 2016-10-08 DIAGNOSIS — J040 Acute laryngitis: Secondary | ICD-10-CM | POA: Insufficient documentation

## 2016-10-08 DIAGNOSIS — H6690 Otitis media, unspecified, unspecified ear: Secondary | ICD-10-CM

## 2016-10-08 DIAGNOSIS — Z888 Allergy status to other drugs, medicaments and biological substances status: Secondary | ICD-10-CM | POA: Insufficient documentation

## 2016-10-08 DIAGNOSIS — R0789 Other chest pain: Secondary | ICD-10-CM | POA: Insufficient documentation

## 2016-10-08 DIAGNOSIS — M797 Fibromyalgia: Secondary | ICD-10-CM | POA: Insufficient documentation

## 2016-10-08 DIAGNOSIS — Z79899 Other long term (current) drug therapy: Secondary | ICD-10-CM | POA: Insufficient documentation

## 2016-10-08 DIAGNOSIS — Z9889 Other specified postprocedural states: Secondary | ICD-10-CM | POA: Insufficient documentation

## 2016-10-08 DIAGNOSIS — F419 Anxiety disorder, unspecified: Secondary | ICD-10-CM | POA: Insufficient documentation

## 2016-10-08 LAB — RAPID STREP SCREEN (MED CTR MEBANE ONLY): Streptococcus, Group A Screen (Direct): NEGATIVE

## 2016-10-08 MED ORDER — HYDROCOD POLST-CPM POLST ER 10-8 MG/5ML PO SUER: 5.0000 mL | Freq: Two times a day (BID) | ORAL | 0 refills | Status: DC

## 2016-10-08 MED ORDER — AMOXICILLIN-POT CLAVULANATE 875-125 MG PO TABS: 1.0000 | ORAL_TABLET | Freq: Two times a day (BID) | ORAL | 0 refills | Status: DC

## 2016-10-08 MED ORDER — BENZONATATE 200 MG PO CAPS: ORAL_CAPSULE | ORAL | 0 refills | Status: DC

## 2016-10-08 NOTE — ED Provider Notes (Signed)
CSN: [id number]     Arrival date & time 10/08/16  1614 History   First MD Initiated Contact with Patient 10/08/16 1632     Chief Complaint  Patient presents with  . Otalgia  . URI   (Consider location/radiation/quality/duration/timing/severity/associated sxs/prior Treatment) HPI  This a 36 year old female who is well-known to our clinic presents with four-day history of bilateral ear pain pressure in her chest with coughing ;phlegm of a green-yellow color and laryngitis. She states that she had body aches and chills  As well. She is afebrile today. Pulse rate of 99 O2 sats 100% on room air. Patient does not look toxic. Is also concerned over a "lump that she feels on the lateral mid thigh. It will occasionally be painful with a sharp fleeting pain        Past Medical History:  Diagnosis Date  . Anxiety    Panic attacks  . Complication of anesthesia    took alot to get to sleep- with wisdom teeth  . Complication of anesthesia    woke up during hand surgery and could see them operating  . 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 Fibromyalgia   . GERD (gastroesophageal reflux disease)   . Headache    last one June 21,2016  . Hypoglycemia TMJ  . Pyelonephritis   . Shortness of breath dyspnea    with exertion   Past Surgical History:  Procedure Laterality Date  . INSERTION OF MESH N/A 12/18/2014   Procedure: INSERTION OF MESH;  Surgeon: Ralene Ok, MD;  Location: [place];  Service: General;  Laterality: N/A;  . LAPAROSCOPIC ABDOMINAL EXPLORATION    . right hand surgery Right    .Screws and graft- fracture  . TENDON REPAIR Right   . UMBILICAL HERNIA REPAIR N/A 12/18/2014   Procedure: LAPAROSCOPIC UMBILICAL HERNIA REPAIR WITH MESH;  Surgeon: Ralene Ok, MD;  Location: [place];  Service: General;  Laterality: N/A;  . WISDOM TOOTH EXTRACTION     History reviewed. No pertinent family history. Social History  Substance Use Topics  . Smoking status: Former Smoker   Packs/day: 0.25    Years: 17.00  . Smokeless tobacco: Never Used  . Alcohol use No   OB History    No data available     Review of Systems  Constitutional: Positive for activity change. Negative for chills, fatigue and fever.  HENT: Positive for congestion, ear pain, postnasal drip, rhinorrhea, sinus pain, sinus pressure, sore throat and voice change.   Respiratory: Positive for cough. Negative for shortness of breath and wheezing.   Musculoskeletal: Positive for myalgias.  All other systems reviewed and are negative.   Allergies  Codeine; Morphine and related; and Tramadol  Home Medications   Prior to Admission medications   Medication Sig Start Date End Date Taking? Authorizing Provider  amoxicillin-clavulanate (AUGMENTIN) 875-125 MG tablet Take 1 tablet by mouth every 12 (twelve) hours. 10/08/16   Lorin Picket, PA-C  benzonatate (TESSALON) 200 MG capsule Take one cap TID PRN cough 10/08/16   Lorin Picket, PA-C  chlorpheniramine-HYDROcodone [facility]) 10-8 MG/5ML SUER Take 5 mLs by mouth 2 (two) times daily. 10/08/16   Malachy Chamber Gudrun Axe, PA-C  JOLIVETTE 0.35 MG tablet Take 1 tablet by mouth daily. 11/05/14   Historical Provider, MD  naproxen (NAPROSYN) 500 MG tablet Take 1 tablet (500 mg total) by mouth 2 (two) times daily with a meal. 08/27/16   Lorin Picket, PA-C  omeprazole (PRILOSEC) 20  MG capsule Take 20 mg by mouth daily.    Historical Provider, MD   Meds Ordered and Administered this Visit  Medications - No data to display  BP (!) 134/56 (BP Location: Left Arm)   Pulse 99   Temp 98.6 F (37 C) (Oral)   Resp 18   Ht 5\' 5"  (1.651 m)   Wt 158 lb (71.7 kg)   LMP 09/24/2016   SpO2 100%   BMI 26.29 kg/m  No data found.   Physical Exam  Constitutional: She is oriented to person, place, and time. She appears well-developed and well-nourished. No distress.  HENT:  Head: Normocephalic and atraumatic.  Nose: Nose normal.  Mouth/Throat: No  oropharyngeal exudate.  Semination of her ears shows erythema and bulging of the right eardrum. Left TM is injected but not swollen. Her i left tonsil is enlarged in comparison to the right but there is no exudate and no significant erythema present. There is no adenopathy present.  Eyes: Pupils are equal, round, and reactive to light.  Neck: Normal range of motion.  Pulmonary/Chest: Effort normal and breath sounds normal. No respiratory distress. She has no wheezes. She has no rales.  Patient has rhonchi with expiration.  Musculoskeletal: Normal range of motion. She exhibits tenderness.  Examination of her left lower extremity was performed in the presence of [facility] CMA. Ambulation has a small. Likely a herniation through the fascia. It is nonmobile . Not Worse with resisted range of motion. It is mildly tender  Lymphadenopathy:    She has no cervical adenopathy.  Neurological: She is alert and oriented to person, place, and time.  Skin: Skin is warm and dry. She is not diaphoretic.  Psychiatric: She has a normal mood and affect. Her behavior is normal. Judgment and thought content normal.  Nursing note and vitals reviewed.   Urgent Care Course     Procedures (including critical care time)  Labs Review Labs Reviewed  RAPID STREP SCREEN (NOT AT [facility])  CULTURE, GROUP A STREP [facility])    Imaging Review No results found.   Visual Acuity Review  Right Eye Distance:   Left Eye Distance:   Bilateral Distance:    Right Eye Near:   Left Eye Near:    Bilateral Near:         MDM   1. Upper respiratory tract infection, unspecified type   2. Acute otitis media, unspecified otitis media type    Discharge Medication List as of 10/08/2016  5:17 PM    START taking these medications   Details  amoxicillin-clavulanate (AUGMENTIN) 875-125 MG tablet Take 1 tablet by mouth every 12 (twelve) hours., Starting Thu 10/08/2016, Normal    benzonatate (TESSALON) 200 MG capsule Take one cap  TID PRN cough, Normal    chlorpheniramine-HYDROcodone (TUSSIONEX PENNKINETIC ER) 10-8 MG/5ML SUER Take 5 mLs by mouth 2 (two) times daily., Starting Thu 10/08/2016, Print      Plan: 1. Test/x-ray results and diagnosis reviewed with patient 2. rx as per orders; risks, benefits, potential side effects reviewed with patient 3. Recommend supportive treatment with Rest and fluids. Caution regarding use of Tussionex with activities requiring concentration and judgment. She should not drive while taking the medication. Even her Tussionex Perles that she continues during the daytime. I will keep her out of work  tomorrow and she should be able to return to work on Saturday 4. F/u prn if symptoms worsen or don't improve     Lorin Picket, PA-C 10/08/16 1810

## 2016-10-08 NOTE — ED Triage Notes (Signed)
Pt c/o bilateral ear pain, losing her voice, heaviness in her chest and coughing up phlegm. She started with some body aches and chills on Monday.

## 2016-10-10 LAB — CULTURE, GROUP A STREP (THRC)

## 2016-10-11 ENCOUNTER — Telehealth: Payer: Self-pay | Admitting: Emergency Medicine

## 2016-10-11 NOTE — Telephone Encounter (Signed)
Patient notified of her throat culture result.  Patient to continue with her Amoxicillin and follow-up here or with PCP if her symptoms persist or worsen.  Patient verbalized understanding.

## 2016-10-27 ENCOUNTER — Ambulatory Visit
Admission: EM | Admit: 2016-10-27 | Discharge: 2016-10-27 | Discharge status: Absent without leave | Payer: Medicaid Other

## 2016-10-31 ENCOUNTER — Ambulatory Visit
Admission: EM | Admit: 2016-10-31 | Discharge: 2016-10-31 | Disposition: A | Discharge status: Home / Self Care | Payer: Medicaid Other | Attending: Family Medicine | Admitting: Family Medicine

## 2016-10-31 DIAGNOSIS — M26623 Arthralgia of bilateral temporomandibular joint: Secondary | ICD-10-CM

## 2016-10-31 MED ORDER — MELOXICAM 15 MG PO TABS: 15.0000 mg | ORAL_TABLET | Freq: Every day | ORAL | 0 refills | Status: DC | PRN

## 2016-10-31 MED ORDER — CYCLOBENZAPRINE HCL 5 MG PO TABS: 5.0000 mg | ORAL_TABLET | Freq: Every evening | ORAL | 0 refills | Status: DC | PRN

## 2016-10-31 NOTE — Discharge Instructions (Signed)
Take medication as prescribed. Rest. Drink plenty of fluids.  ° °Follow up with your primary care physician this week as needed. Return to Urgent care for new or worsening concerns.  ° °

## 2016-10-31 NOTE — ED Provider Notes (Signed)
[facility] ____________________________________________  Time seen: Approximately 1:07 PM  I have reviewed the triage vital signs and the nursing notes.   HISTORY  Chief Complaint Otalgia   HPI Kylie Curtis is a 36 y.o. female  presenting for evaluation of bilateral ear discomfort. Patient states mild pain to bilateral ears. Patient reports she was recently seen in the urgent care approximately 3-4 weeks ago and diagnosed with bilateral ear infection. Patient states that she has since completed her antibiotics but states some discomfort continues to hurt ears. Denies any drainage or trauma. Denies hearing changes. Denies tinnitus. Reports still has occasional runny nose consistent with her seasonal allergies. Denies sinus pressure or continued sinus discomfort. Denies sore throat or fevers. Reports continues to eat and drink well. Denies chronic issues with her ears as an adult. Reports otherwise feels well.  Denies chest pain, shortness of breath, abdominal pain, dysuria, or rash.   Past Medical History:  Diagnosis Date  . Anxiety    Panic attacks  . Complication of anesthesia    took alot to get to sleep- with wisdom teeth  . Complication of anesthesia    woke up during hand surgery and could see them operating  . 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 Fibromyalgia   . GERD (gastroesophageal reflux disease)   . Headache    last one June 21,2016  . Hypoglycemia TMJ  . Pyelonephritis   . Shortness of breath dyspnea    with exertion    There are no active problems to display for this patient.   Past Surgical History:  Procedure Laterality Date  . INSERTION OF MESH N/A 12/18/2014   Procedure: INSERTION OF MESH;  Surgeon: Ralene Ok, MD;  Location: [place];  Service: General;  Laterality: N/A;  . LAPAROSCOPIC ABDOMINAL EXPLORATION    . right hand surgery Right    .Screws and graft- fracture  . TENDON REPAIR Right   . UMBILICAL HERNIA REPAIR N/A  12/18/2014   Procedure: LAPAROSCOPIC UMBILICAL HERNIA REPAIR WITH MESH;  Surgeon: Ralene Ok, MD;  Location: [place];  Service: General;  Laterality: N/A;  . WISDOM TOOTH EXTRACTION       No current facility-administered medications for this encounter.   Current Outpatient Prescriptions:  .  JOLIVETTE 0.35 MG tablet, Take 1 tablet by mouth daily., Disp: , Rfl: 9 .  omeprazole (PRILOSEC) 20 MG capsule, Take 20 mg by mouth daily., Disp: , Rfl:  .  amoxicillin-clavulanate (AUGMENTIN) 875-125 MG tablet, Take 1 tablet by mouth every 12 (twelve) hours., Disp: 20 tablet, Rfl: 0 .  benzonatate (TESSALON) 200 MG capsule, Take one cap TID PRN cough, Disp: 21 capsule, Rfl: 0 .  chlorpheniramine-HYDROcodone (TUSSIONEX PENNKINETIC ER) 10-8 MG/5ML SUER, Take 5 mLs by mouth 2 (two) times daily., Disp: 115 mL, Rfl: 0 .  cyclobenzaprine (FLEXERIL) 5 MG tablet, Take 1 tablet (5 mg total) by mouth at bedtime as needed (pain). Do not drive as can cause drowsiness., Disp: 14 tablet, Rfl: 0 .  meloxicam (MOBIC) 15 MG tablet, Take 1 tablet (15 mg total) by mouth daily as needed., Disp: 10 tablet, Rfl: 0 .  naproxen (NAPROSYN) 500 MG tablet, Take 1 tablet (500 mg total) by mouth 2 (two) times daily with a meal., Disp: 60 tablet, Rfl: 0  Allergies Codeine; Morphine and related; and Tramadol  No family history on file.  Social History Social History  Substance Use Topics  . Smoking status: Former Smoker    Packs/day:  0.25    Years: 17.00  . Smokeless tobacco: Never Used  . Alcohol use No    Review of Systems Constitutional: No fever/chills Eyes: No visual changes. ENT: No sore throat. Cardiovascular: Denies chest pain. Respiratory: Denies shortness of breath. Gastrointestinal: No abdominal pain.  No nausea, no vomiting.  No diarrhea.  No constipation. Genitourinary: Negative for dysuria. Musculoskeletal: Negative for back pain. Skin: Negative for rash. Neurological: Negative for headaches, focal  weakness or numbness.  10-point ROS otherwise negative.  ____________________________________________   PHYSICAL EXAM:  VITAL SIGNS: ED Triage Vitals  Enc Vitals Group     BP 10/31/16 1113 (!) 119/50     Pulse Rate 10/31/16 1113 89     Resp 10/31/16 1113 16     Temp 10/31/16 1113 98.2 F (36.8 C)     Temp Source 10/31/16 1113 Oral     SpO2 10/31/16 1113 100 %     Weight 10/31/16 1110 150 lb (68 kg)     Height 10/31/16 1110 5\' 5"  (1.651 m)     Head Circumference --      Peak Flow --      Pain Score 10/31/16 1110 8     Pain Loc --      Pain Edu? --      Excl. in [place]? --     Constitutional: Alert and oriented. Well appearing and in no acute distress. Eyes: Conjunctivae are normal. PERRL. EOMI. Head: Atraumatic. No sinus tenderness to palpation. No swelling. No erythema. Bilateral TMJ moderate tenderness to direct palpation, full range of motion present.  Ears: no erythema, normal TMs bilaterally. No surrounding tenderness, swelling or erythema bilaterally.  Nose:No nasal congestion or rhinorrhea.  Mouth/Throat: Mucous membranes are moist. No pharyngeal erythema. No tonsillar swelling or exudate.  Neck: No stridor.  No cervical spine tenderness to palpation. Hematological/Lymphatic/Immunilogical: No cervical lymphadenopathy. Cardiovascular: Normal rate, regular rhythm. Grossly normal heart sounds.  Good peripheral circulation. Respiratory: Normal respiratory effort.  No retractions. No wheezes, rales or rhonchi. Good air movement.  Musculoskeletal: Ambulatory with steady gait. No cervical, thoracic or lumbar tenderness to palpation. Neurologic:  Normal speech and language. No gait instability. Skin:  Skin appears warm, dry Psychiatric: Mood and affect are normal. Speech and behavior are normal.  ___________________________________________   LABS (all labs ordered are listed, but only abnormal results are displayed)  Labs Reviewed - No data to  display ____________________________________________  RADIOLOGY  No results found. ____________________________________________   PROCEDURES Procedures   INITIAL IMPRESSION / ASSESSMENT AND PLAN / ED COURSE  Pertinent labs & imaging results that were available during my care of the patient were reviewed by me and considered in my medical decision making (see chart for details).  Well-appearing patient. No acute distress. Ears well appearing. Patient does report grinding her teeth at night and frequently clenching down. Patient with bilateral TMJ tenderness, suspect TMJ pain causing presenting your discomfort. Patient reports that she has a dentist appointment this coming week. Discussed and encouraged discussion of dental guard. Will start patient on oral daily Mobic and as needed Flexeril at night. Discussed avoidance of aggravating factors, to eat foods and follow-up with dentist and primary care physician.Discussed indication, risks and benefits of medications with patient.  Discussed follow up with Primary care physician this week. Discussed follow up and return parameters including no resolution or any worsening concerns. Patient verbalized understanding and agreed to plan.   ____________________________________________   FINAL CLINICAL IMPRESSION(S) / ED DIAGNOSES  Final diagnoses:  Bilateral  temporomandibular joint pain     Discharge Medication List as of 10/31/2016 11:45 AM    START taking these medications   Details  cyclobenzaprine (FLEXERIL) 5 MG tablet Take 1 tablet (5 mg total) by mouth at bedtime as needed (pain). Do not drive as can cause drowsiness., Starting Sat 10/31/2016, Normal    meloxicam (MOBIC) 15 MG tablet Take 1 tablet (15 mg total) by mouth daily as needed., Starting Sat 10/31/2016, Normal        Note: This dictation was prepared with Dragon dictation along with smaller phrase technology. Any transcriptional errors that result from this process are  unintentional.         Marylene Land, NP 10/31/16 1517

## 2016-10-31 NOTE — ED Triage Notes (Signed)
Follow up for ear pain both side finished antibiotics and still hurts.

## 2017-01-02 ENCOUNTER — Ambulatory Visit
Admission: EM | Admit: 2017-01-02 | Discharge: 2017-01-02 | Disposition: A | Discharge status: Home / Self Care | Payer: Medicaid Other | Attending: Family Medicine | Admitting: Family Medicine

## 2017-01-02 ENCOUNTER — Encounter: Payer: Self-pay | Admitting: Gynecology

## 2017-01-02 DIAGNOSIS — Z87891 Personal history of nicotine dependence: Secondary | ICD-10-CM | POA: Insufficient documentation

## 2017-01-02 DIAGNOSIS — Z79899 Other long term (current) drug therapy: Secondary | ICD-10-CM | POA: Diagnosis not present

## 2017-01-02 DIAGNOSIS — K1379 Other lesions of oral mucosa: Secondary | ICD-10-CM | POA: Diagnosis present

## 2017-01-02 DIAGNOSIS — K219 Gastro-esophageal reflux disease without esophagitis: Secondary | ICD-10-CM | POA: Diagnosis not present

## 2017-01-02 DIAGNOSIS — Z885 Allergy status to narcotic agent status: Secondary | ICD-10-CM | POA: Diagnosis not present

## 2017-01-02 DIAGNOSIS — K1370 Unspecified lesions of oral mucosa: Secondary | ICD-10-CM | POA: Diagnosis not present

## 2017-01-02 DIAGNOSIS — R519 Headache, unspecified: Secondary | ICD-10-CM

## 2017-01-02 DIAGNOSIS — R51 Headache: Secondary | ICD-10-CM | POA: Insufficient documentation

## 2017-01-02 DIAGNOSIS — J029 Acute pharyngitis, unspecified: Secondary | ICD-10-CM | POA: Diagnosis not present

## 2017-01-02 DIAGNOSIS — M797 Fibromyalgia: Secondary | ICD-10-CM | POA: Diagnosis not present

## 2017-01-02 LAB — RAPID STREP SCREEN (MED CTR MEBANE ONLY): Streptococcus, Group A Screen (Direct): NEGATIVE

## 2017-01-02 MED ORDER — MAGIC MOUTHWASH W/LIDOCAINE: 5.0000 mL | Freq: Three times a day (TID) | ORAL | 0 refills | Status: DC | PRN

## 2017-01-02 MED ORDER — IBUPROFEN 800 MG PO TABS
800.0000 mg | ORAL_TABLET | Freq: Once | ORAL | Status: AC
  Administered 2017-01-02: 800 mg via ORAL

## 2017-01-02 NOTE — Discharge Instructions (Signed)
Take medication as prescribed. Rest. Drink plenty of fluids.  ° °Follow up with your primary care physician this week as needed. Return to Urgent care for new or worsening concerns.  ° °

## 2017-01-02 NOTE — ED Triage Notes (Signed)
Patient c/o notice mouth blister in mouth x yesterday. 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

## 2017-01-02 NOTE — ED Provider Notes (Signed)
[facility] ____________________________________________  Time seen: Approximately 11:51 AM  I have reviewed the triage vital signs and the nursing notes.   HISTORY  Chief Complaint Mouth blister  HPI Kylie Curtis is a 36 y.o. female  Presents for evaluation of one single sore to the inside of her mouth. 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 Patient also reports she had a mild sore throat yesterday that's better today. Patient also reports that she does have frequent headaches similar to a headache that she currently has been described as mild to moderate, states she has not taken any medicines over-the-counter for headache currently. States yesterday she did take one aspirin without change.   Reports she normally takes ibuprofen and that resolves her headaches, but had ran out of ibuprofen. Patient states headache is similar to her typical headache of hers. Patient also states that she feels that she hasn't been drinking as much water she normally should be when she is outside a lot and states that she feels that can contribute to her headache. Patient states that sore throat have improved. Reports continues to eat and drink well. Has not noticed any other sores in her mouth. States she does not typically have sores in her mouth. Patient denies any rash, skin changes or itching.   Denies any weakness, tingling, paresthesias, pain radiation. Denies any atypical neck or back pain. Denies stiff neck. Denies fevers. Reports continues to eat and drink well. Denies chest pain, shortness of breath, abdominal pain, dysuria, extremity pain, extremity swelling or rash. Denies recent sickness. Denies recent antibiotic use.   [facility]: PCP Patient's last menstrual period was 12/26/2016.Denies pregnancy.   Past Medical History:    Diagnosis Date  . Anxiety    Panic attacks  . Complication of anesthesia    took alot to get to sleep- with wisdom teeth  . Complication of anesthesia    woke up during hand surgery and could see them operating  . 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 Fibromyalgia   . GERD (gastroesophageal reflux disease)   . Headache    last one June 21,2016  . Hypoglycemia TMJ  . Pyelonephritis   . Shortness of breath dyspnea    with exertion    There are no active problems to display for this patient.   Past Surgical History:  Procedure Laterality Date  . INSERTION OF MESH N/A 12/18/2014   Procedure: INSERTION OF MESH;  Surgeon: Ralene Ok, MD;  Location: [place];  Service: General;  Laterality: N/A;  . LAPAROSCOPIC ABDOMINAL EXPLORATION    . right hand surgery Right    .Screws and graft- fracture  . TENDON REPAIR Right   . UMBILICAL HERNIA REPAIR N/A 12/18/2014   Procedure: LAPAROSCOPIC UMBILICAL HERNIA REPAIR WITH MESH;  Surgeon: Ralene Ok, MD;  Location: [place];  Service: General;  Laterality: N/A;  . WISDOM TOOTH EXTRACTION       No current facility-administered medications for this encounter.   Current Outpatient Prescriptions:  .  JOLIVETTE 0.35 MG tablet, Take 1 tablet by mouth daily., Disp: , Rfl: 9 .  omeprazole (PRILOSEC) 20 MG capsule, Take 20 mg by mouth daily., Disp: , Rfl:  .  amoxicillin-clavulanate (AUGMENTIN) 875-125 MG tablet, Take 1 tablet by mouth every 12 (twelve) hours., Disp: 20 tablet, Rfl: 0 .  benzonatate (TESSALON) 200 MG capsule,  Take one cap TID PRN cough, Disp: 21 capsule, Rfl: 0 .  chlorpheniramine-HYDROcodone (TUSSIONEX PENNKINETIC ER) 10-8 MG/5ML SUER, Take 5 mLs by mouth 2 (two) times daily., Disp: 115 mL, Rfl: 0 .  cyclobenzaprine (FLEXERIL) 5 MG tablet, Take 1 tablet (5 mg total) by mouth at bedtime as needed (pain). Do not drive as can cause drowsiness., Disp: 14 tablet, Rfl: 0 .  magic mouthwash w/lidocaine SOLN, Take 5 mLs by mouth 3  (three) times daily as needed for mouth pain., Disp: 100 mL, Rfl: 0 .  meloxicam (MOBIC) 15 MG tablet, Take 1 tablet (15 mg total) by mouth daily as needed., Disp: 10 tablet, Rfl: 0 .  naproxen (NAPROSYN) 500 MG tablet, Take 1 tablet (500 mg total) by mouth 2 (two) times daily with a meal., Disp: 60 tablet, Rfl: 0  Allergies Codeine; Morphine and related; and Tramadol  No family history on file.  Social History Social History  Substance Use Topics  . Smoking status: Former Smoker    Packs/day: 0.25    Years: 17.00  . Smokeless tobacco: Never Used  . Alcohol use No    Review of Systems Constitutional: No fever/chills Eyes: No visual changes. ENT: As above.  Cardiovascular: Denies chest pain. Respiratory: Denies shortness of breath. Gastrointestinal: No abdominal pain.  Genitourinary: Negative for dysuria. Musculoskeletal: Negative for back pain. Skin: Negative for rash. Neurological: Negative for focal weakness or numbness.   ____________________________________________   PHYSICAL EXAM:  VITAL SIGNS: ED Triage Vitals  Enc Vitals Group     BP 01/02/17 1122 117/75     Pulse Rate 01/02/17 1122 82     Resp 01/02/17 1122 16     Temp 01/02/17 1122 99 F (37.2 C)     Temp Source 01/02/17 1122 Oral     SpO2 01/02/17 1122 100 %     Weight 01/02/17 1120 152 lb (68.9 kg)     Height 01/02/17 1120 5\' 5"  (1.651 m)     Head Circumference --      Peak Flow --      Pain Score 01/02/17 1120 3     Pain Loc --      Pain Edu? --      Excl. in [place]? --    Constitutional: Alert and oriented. Well appearing and in no acute distress. Eyes: Conjunctivae are normal. Head: Atraumatic. No sinus tenderness to palpation. No swelling. No erythema.  Ears: no erythema, normal TMs bilaterally.   Nose:No nasal congestion or rhinorrhea.   Mouth/Throat: Mucous membranes are moist. Minimal pharyngeal erythema. No tonsillar swelling or exudate. Mild posterior pharyngeal cobblestoning noted. No lip,  tongue or oral swelling noted. Patient along left lower gum line crease has one singular less than 0.25 cm abscess appearing ulcer that appears to be healing with minimal tenderness to direct palpation, and no surrounding erythema, and tenderness.No other oral lesions noted.  Neck: No stridor.  No cervical spine tenderness to palpation. Hematological/Lymphatic/Immunilogical: No cervical lymphadenopathy. Cardiovascular: Normal rate, regular rhythm. Grossly normal heart sounds.  Good peripheral circulation. Respiratory: Normal respiratory effort.  No retractions. No wheezes, rales or rhonchi. Good air movement.  Musculoskeletal: Ambulatory with steady gait. No cervical, thoracic or lumbar tenderness to palpation. Neurologic:  Normal speech and language. No gait instability. No meningismus. 5/5 strength to bilateral upper and lower extremities. Negative pronator drift. Steady gait. No focal neurologic deficits noted.  Skin:  Skin appears warm, dry and intact. No rash noted. No rash noted, and no rash noted to  palms of hands or soles of feet.  Psychiatric: Mood and affect are normal. Speech and behavior are normal.  ___________________________________________   LABS (all labs ordered are listed, but only abnormal results are displayed)  Labs Reviewed  RAPID STREP SCREEN (NOT AT [facility])  CULTURE, GROUP A STREP [facility])     PROCEDURES Procedures     INITIAL IMPRESSION / ASSESSMENT AND PLAN / ED COURSE  Pertinent labs & imaging results that were available during my care of the patient were reviewed by me and considered in my medical decision making (see chart for details).  Very well-appearing patient. No acute distress. Presents for evaluation of one singular office appearing ulcers inside of left lower mouth that appears to be healing well. 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 No other rash or oral lesions  noted. Patient also reports that she does currently have a headache, but states feels consistent with previous headaches that she often has and states that she did not have anymore ibuprofen at home to take as she typically does. 800 mg ibuprofen given once in urgent care. Quick strep negative, will culture. Discussed with patient monitoring mouth and other areas or any other concerns. Will treat with Magic mouthwash as needed for any discomfort in mouth. Discussed as no other lesions present in area could be second to injury from eating, do not feel that this is hand-foot-and-mouth at this time based on current presentation with singular area. Discussed indication, risks and benefits of medications with patient. Encouraged rest, fluids, and follow up as needed.   Discussed follow up with Primary care physician this week. Discussed follow up and return parameters including no resolution or any worsening concerns. Patient verbalized understanding and agreed to plan.   ____________________________________________   FINAL CLINICAL IMPRESSION(S) / ED DIAGNOSES  Final diagnoses:  Sore in mouth  Nonintractable headache, unspecified chronicity pattern, unspecified headache type     Discharge Medication List as of 01/02/2017 11:56 AM    START taking these medications   Details  magic mouthwash w/lidocaine SOLN Take 5 mLs by mouth 3 (three) times daily as needed for mouth pain., Starting Sat 01/02/2017, Print        Note: This dictation was prepared with Dragon dictation along with smaller phrase technology. Any transcriptional errors that result from this process are unintentional.         Marylene Land, NP 01/02/17 1240

## 2017-01-04 LAB — CULTURE, GROUP A STREP (THRC)

## 2017-02-21 ENCOUNTER — Encounter: Payer: Self-pay | Admitting: Gynecology

## 2017-02-21 ENCOUNTER — Ambulatory Visit
Admission: EM | Admit: 2017-02-21 | Discharge: 2017-02-21 | Disposition: A | Discharge status: Home / Self Care | Payer: Medicaid Other | Attending: Family Medicine | Admitting: Family Medicine

## 2017-02-21 DIAGNOSIS — F419 Anxiety disorder, unspecified: Secondary | ICD-10-CM | POA: Insufficient documentation

## 2017-02-21 DIAGNOSIS — M797 Fibromyalgia: Secondary | ICD-10-CM | POA: Diagnosis not present

## 2017-02-21 DIAGNOSIS — R110 Nausea: Secondary | ICD-10-CM | POA: Diagnosis present

## 2017-02-21 DIAGNOSIS — K219 Gastro-esophageal reflux disease without esophagitis: Secondary | ICD-10-CM | POA: Insufficient documentation

## 2017-02-21 DIAGNOSIS — Z87891 Personal history of nicotine dependence: Secondary | ICD-10-CM | POA: Insufficient documentation

## 2017-02-21 DIAGNOSIS — R197 Diarrhea, unspecified: Secondary | ICD-10-CM

## 2017-02-21 LAB — CBC WITH DIFFERENTIAL/PLATELET
Basophils Absolute: 0 10*3/uL (ref 0–0.1)
Basophils Relative: 1 %
Eosinophils Absolute: 0.2 10*3/uL (ref 0–0.7)
Eosinophils Relative: 2 %
HCT: 45.3 % (ref 35.0–47.0)
Hemoglobin: 15.4 g/dL (ref 12.0–16.0)
Lymphocytes Relative: 34 %
Lymphs Abs: 3 10*3/uL (ref 1.0–3.6)
MCH: 29.1 pg (ref 26.0–34.0)
MCHC: 34 g/dL (ref 32.0–36.0)
MCV: 85.5 fL (ref 80.0–100.0)
Monocytes Absolute: 0.9 10*3/uL (ref 0.2–0.9)
Monocytes Relative: 11 %
Neutro Abs: 4.6 10*3/uL (ref 1.4–6.5)
Neutrophils Relative %: 52 %
Platelets: 219 10*3/uL (ref 150–440)
RBC: 5.3 MIL/uL — ABNORMAL HIGH (ref 3.80–5.20)
RDW: 13.7 % (ref 11.5–14.5)
WBC: 8.8 10*3/uL (ref 3.6–11.0)

## 2017-02-21 LAB — BASIC METABOLIC PANEL
Anion gap: 10 (ref 5–15)
BUN: 14 mg/dL (ref 6–20)
CO2: 24 mmol/L (ref 22–32)
Calcium: 9.9 mg/dL (ref 8.9–10.3)
Chloride: 103 mmol/L (ref 101–111)
Creatinine, Ser: 0.78 mg/dL (ref 0.44–1.00)
GFR calc Af Amer: >60 mL/min (ref >60)
GFR calc non Af Amer: >60 mL/min (ref >60)
Glucose, Bld: 100 mg/dL — ABNORMAL HIGH (ref 65–99)
Potassium: 4 mmol/L (ref 3.5–5.1)
Sodium: 137 mmol/L (ref 135–145)

## 2017-02-21 NOTE — ED Triage Notes (Signed)
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 Patient stated had diarrhea x 3 days. Patient stated some nausea but no vomiting.

## 2017-02-21 NOTE — Discharge Instructions (Signed)
Drink plenty of fluids to keep your urine clear to yellow. Cultures of your stool should be available tomorrow. If you do not hear from [place] by early afternoon call the clinic.

## 2017-02-21 NOTE — ED Provider Notes (Signed)
[facility]    CSN: [id number] Arrival date & time: 02/21/17  1311     History   Chief Complaint Chief Complaint  Patient presents with  . Diarrhea    HPI OCEANNA Curtis is a 36 y.o. female.   HPI  Is a 36 year old female who is well-known to our practice presents with a history of 3 days of diarrhea with nausea but no vomiting. The patient states that she feels very weak and fatigued; has a dry mouth. Her urine is a medium yellow to darker yellow color. 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 That she has stomach cramping right before the diarrhea and then it subsides after she has her bowel movement. She had 5 bowel movements on Thursday 9 on Friday 3 on Saturday and for today. Not had any vomiting. No blood or mucus in the stool. She's not had any antibiotics in the past 3 months at least.      Past Medical History:  Diagnosis Date  . Anxiety    Panic attacks  . Complication of anesthesia    took alot to get to sleep- with wisdom teeth  . Complication of anesthesia    woke up during hand surgery and could see them operating  . 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 Fibromyalgia   . GERD (gastroesophageal reflux disease)   . Headache    last one June 21,2016  . Hypoglycemia TMJ  . Pyelonephritis   . Shortness of breath dyspnea    with exertion    There are no active problems to display for this patient.   Past Surgical History:  Procedure Laterality Date  . INSERTION OF MESH N/A 12/18/2014   Procedure: INSERTION OF MESH;  Surgeon: Ralene Ok, MD;  Location: [place];  Service: General;  Laterality: N/A;  . LAPAROSCOPIC ABDOMINAL EXPLORATION    . right hand surgery Right    .Screws and graft- fracture  . TENDON REPAIR Right   . UMBILICAL HERNIA REPAIR N/A 12/18/2014   Procedure: LAPAROSCOPIC UMBILICAL HERNIA REPAIR WITH MESH;  Surgeon: Ralene Ok, MD;  Location: [place];  Service: General;  Laterality: N/A;    . WISDOM TOOTH EXTRACTION      OB History    No data available       Home Medications    Prior to Admission medications   Medication Sig Start Date End Date Taking? Authorizing Provider  JOLIVETTE 0.35 MG tablet Take 1 tablet by mouth daily. 11/05/14  Yes [provider]  omeprazole (PRILOSEC) 20 MG capsule Take 20 mg by mouth daily.   Yes [provider]  amoxicillin-clavulanate (AUGMENTIN) 875-125 MG tablet Take 1 tablet by mouth every 12 (twelve) hours. 10/08/16   Lorin Picket, PA-C  benzonatate (TESSALON) 200 MG capsule Take one cap TID PRN cough 10/08/16   Lorin Picket, PA-C  chlorpheniramine-HYDROcodone [facility] ER) 10-8 MG/5ML SUER Take 5 mLs by mouth 2 (two) times daily. 10/08/16   Lorin Picket, PA-C  cyclobenzaprine (FLEXERIL) 5 MG tablet Take 1 tablet (5 mg total) by mouth at bedtime as needed (pain). Do not drive as can cause drowsiness. 10/31/16   Marylene Land, NP  magic mouthwash w/lidocaine SOLN Take 5 mLs by mouth 3 (three) times daily as needed for mouth pain. 01/02/17   Marylene Land, NP  meloxicam (MOBIC) 15 MG tablet Take 1 tablet (15 mg total) by mouth daily as needed. 10/31/16  Marylene Land, NP  naproxen (NAPROSYN) 500 MG tablet Take 1 tablet (500 mg total) by mouth 2 (two) times daily with a meal. 08/27/16   Lorin Picket, PA-C    Family History No family history on file.  Social History Social History  Substance Use Topics  . Smoking status: Former Smoker    Packs/day: 0.25    Years: 17.00  . Smokeless tobacco: Never Used  . Alcohol use No     Allergies   Codeine; Morphine and related; and Tramadol   Review of Systems Review of Systems  Constitutional: Positive for activity change, appetite change and fatigue. Negative for chills and fever.  Gastrointestinal: Positive for abdominal pain, diarrhea and nausea. Negative for abdominal distention, anal bleeding, blood in stool, constipation, rectal  pain and vomiting.  All other systems reviewed and are negative.    Physical Exam Triage Vital Signs ED Triage Vitals  Enc Vitals Group     BP 02/21/17 1322 114/77     Pulse Rate 02/21/17 1322 86     Resp 02/21/17 1322 16     Temp 02/21/17 1322 98.6 F (37 C)     Temp Source 02/21/17 1322 Oral     SpO2 02/21/17 1322 99 %     Weight 02/21/17 1324 152 lb (68.9 kg)     Height --      Head Circumference --      Peak Flow --      Pain Score 02/21/17 1324 10     Pain Loc --      Pain Edu? --      Excl. in [place]? --    Orthostatic VS for the past 24 hrs:  BP- Lying Pulse- Lying BP- Sitting Pulse- Sitting BP- Standing at 0 minutes Pulse- Standing at 0 minutes  02/21/17 1443 114/63 78 103/69 88 125/64 94    Updated Vital Signs BP 114/77 (BP Location: Left Arm)   Pulse 86   Temp 98.6 F (37 C) (Oral)   Resp 16   Wt 152 lb (68.9 kg)   LMP 02/20/2017   SpO2 99%   BMI 25.29 kg/m   Visual Acuity Right Eye Distance:   Left Eye Distance:   Bilateral Distance:    Right Eye Near:   Left Eye Near:    Bilateral Near:     Physical Exam  Constitutional: She is oriented to person, place, and time. She appears well-developed and well-nourished. No distress.  HENT:  Head: Normocephalic.  Eyes: Pupils are equal, round, and reactive to light.  Neck: Normal range of motion.  Abdominal: Soft. Bowel sounds are normal. She exhibits no distension. There is tenderness. There is no rebound and no guarding.  Examination of the abdomen was performed in the presence of Rosendo Gros, CNM a as Loss adjuster, chartered. Abdomen shows no distention. The bowel sounds are hypotonic but present. Abdomen is soft. She has tenderness diffusely mostly on the left mid abdomen. There is no guarding no rebound present.  Musculoskeletal: Normal range of motion.  Neurological: She is alert and oriented to person, place, and time.  Skin: Skin is warm and dry. She is not diaphoretic.  Psychiatric: She has a normal  mood and affect. Her behavior is normal. Judgment and thought content normal.  Nursing note and vitals reviewed.    UC Treatments / Results  Labs (all labs ordered are listed, but only abnormal results are displayed) Labs Reviewed  CBC WITH DIFFERENTIAL/PLATELET - Abnormal; Notable for the following:  Result Value   RBC 5.30 (*)    All other components within normal limits  BASIC METABOLIC PANEL - Abnormal; Notable for the following:    Glucose, Bld 100 (*)    All other components within normal limits  GASTROINTESTINAL PANEL BY PCR, STOOL (REPLACES STOOL CULTURE)    EKG  EKG Interpretation None       Radiology No results found.  Procedures Procedures (including critical care time)  Medications Ordered in UC Medications - No data to display   Initial Impression / Assessment and Plan / UC Course  I have reviewed the triage vital signs and the nursing notes.  Pertinent labs & imaging results that were available during my care of the patient were reviewed by me and considered in my medical decision making (see chart for details).     Plan: 1. Test/x-ray results and diagnosis reviewed with patient 2. rx as per orders; risks, benefits, potential side effects reviewed with patient 3. Recommend supportive treatment with enough hydration to keep urine clear to pale yellow. GI panel by PCR will be available tomorrow. If you do have bacterial infection and if it's indicated she was treated with antibiotics. Otherwise this is likely a viral illness and will have to run its course. I've impressed upon her the absolute need for proper hydration. If any of her symptoms change or worsen she should follow-up at the emergency room. 4. F/u prn if symptoms worsen or don't improve   Final Clinical Impressions(s) / UC Diagnoses   Final diagnoses:  Diarrhea of presumed infectious origin  Nausea    New Prescriptions Discharge Medication List as of 02/21/2017  3:09 PM        Controlled Substance Prescriptions [place] Controlled Substance Registry consulted? Not Applicable   Lorin Picket, PA-C 02/21/17 2956

## 2017-02-22 LAB — GASTROINTESTINAL PANEL BY PCR, STOOL (REPLACES STOOL CULTURE)
Adenovirus F40/41: NOT DETECTED
Astrovirus: NOT DETECTED
Campylobacter species: NOT DETECTED
Cryptosporidium: NOT DETECTED
Cyclospora cayetanensis: NOT DETECTED
Entamoeba histolytica: NOT DETECTED
Enteroaggregative E coli (EAEC): NOT DETECTED
Enteropathogenic E coli (EPEC): DETECTED — AB
Enterotoxigenic E coli (ETEC): NOT DETECTED
Giardia lamblia: NOT DETECTED
Norovirus GI/GII: NOT DETECTED
Plesimonas shigelloides: NOT DETECTED
Rotavirus A: NOT DETECTED
Salmonella species: NOT DETECTED
Sapovirus (I, II, IV, and V): DETECTED — AB
Shiga like toxin producing E coli (STEC): NOT DETECTED
Shigella/Enteroinvasive E coli (EIEC): NOT DETECTED
Vibrio cholerae: NOT DETECTED
Vibrio species: NOT DETECTED
Yersinia enterocolitica: NOT DETECTED

## 2017-05-09 ENCOUNTER — Emergency Department
Admission: EM | Admit: 2017-05-09 | Discharge: 2017-05-09 | Disposition: A | Discharge status: Home / Self Care | Payer: Medicaid Other | Attending: Emergency Medicine | Admitting: Emergency Medicine

## 2017-05-09 ENCOUNTER — Other Ambulatory Visit: Payer: Self-pay

## 2017-05-09 ENCOUNTER — Emergency Department: Payer: Medicaid Other

## 2017-05-09 DIAGNOSIS — M542 Cervicalgia: Secondary | ICD-10-CM | POA: Diagnosis present

## 2017-05-09 DIAGNOSIS — Z79899 Other long term (current) drug therapy: Secondary | ICD-10-CM | POA: Diagnosis not present

## 2017-05-09 DIAGNOSIS — Z87891 Personal history of nicotine dependence: Secondary | ICD-10-CM | POA: Diagnosis not present

## 2017-05-09 DIAGNOSIS — M62838 Other muscle spasm: Secondary | ICD-10-CM

## 2017-05-09 DIAGNOSIS — M436 Torticollis: Secondary | ICD-10-CM

## 2017-05-09 MED ORDER — DEXAMETHASONE SODIUM PHOSPHATE 10 MG/ML IJ SOLN
10.0000 mg | Freq: Once | INTRAMUSCULAR | Status: AC
  Administered 2017-05-09: 10 mg via INTRAMUSCULAR
  Filled 2017-05-09: qty 1

## 2017-05-09 MED ORDER — ORPHENADRINE CITRATE ER 100 MG PO TB12: 100.0000 mg | ORAL_TABLET | Freq: Two times a day (BID) | ORAL | 0 refills | Status: AC

## 2017-05-09 MED ORDER — ORPHENADRINE CITRATE 30 MG/ML IJ SOLN
60.0000 mg | Freq: Once | INTRAMUSCULAR | Status: AC
  Administered 2017-05-09: 60 mg via INTRAMUSCULAR
  Filled 2017-05-09: qty 2

## 2017-05-09 MED ORDER — PREDNISONE 10 MG (21) PO TBPK: ORAL_TABLET | ORAL | 0 refills | Status: DC

## 2017-05-09 MED ORDER — HYDROCODONE-ACETAMINOPHEN 5-325 MG PO TABS: 1.0000 | ORAL_TABLET | Freq: Four times a day (QID) | ORAL | 0 refills | Status: DC | PRN

## 2017-05-09 NOTE — Discharge Instructions (Signed)
Take medication as prescribed.   Return to emergency department if symptoms significantly worsen or follow-up with PCP as needed.

## 2017-05-09 NOTE — ED Notes (Signed)
Patient states she took a Percocet and a Flexeril yesterday which didn't diminish the pain.

## 2017-05-09 NOTE — ED Provider Notes (Signed)
[facility] Emergency Department Provider Note   ____________________________________________   I have reviewed the triage vital signs and the nursing notes.   HISTORY  Chief Complaint Neck Pain    HPI Kylie Curtis is a 36 y.o. female presents to the emergency department with neck pain, stiffness and limited movement since Saturday.  Patient reports she awoke with the above symptoms.  She states she experienced no symptoms the day before and she is denying any traumatic injury.  Patient reports no past history of neck injury or surgeries.  Patient reports increased pain with active cervical rotation, extension and flexion.  She describes pain is sharp and catching especially with active movement.  Denies any changes in vision, tinnitus, nausea, vomiting or headache. Patient denies fever, chills, chest pain, chest tightness, shortness of breath orabdominal pain.  Past Medical History:  Diagnosis Date  . Anxiety    Panic attacks  . Complication of anesthesia    took alot to get to sleep- with wisdom teeth  . Complication of anesthesia    woke up during hand surgery and could see them operating  . 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 Fibromyalgia   . GERD (gastroesophageal reflux disease)   . Headache    last one June 21,2016  . Hypoglycemia TMJ  . Pyelonephritis   . Shortness of breath dyspnea    with exertion    There are no active problems to display for this patient.   Past Surgical History:  Procedure Laterality Date  . INSERTION OF MESH N/A 12/18/2014   Performed by Ralene Ok, MD at [facility]  . LAPAROSCOPIC ABDOMINAL EXPLORATION    . LAPAROSCOPIC UMBILICAL HERNIA REPAIR WITH MESH N/A 12/18/2014   Performed by Ralene Ok, MD at [place]  . right hand surgery Right    .Screws and graft- fracture  . TENDON REPAIR Right   . WISDOM TOOTH EXTRACTION      Prior to Admission medications   Medication Sig Start Date End Date  Taking? Authorizing Provider  amoxicillin-clavulanate (AUGMENTIN) 875-125 MG tablet Take 1 tablet by mouth every 12 (twelve) hours. 10/08/16   Lorin Picket, PA-C  benzonatate (TESSALON) 200 MG capsule Take one cap TID PRN cough 10/08/16   Lorin Picket, PA-C  chlorpheniramine-HYDROcodone [facility]) 10-8 MG/5ML SUER Take 5 mLs by mouth 2 (two) times daily. 10/08/16   Lorin Picket, PA-C  cyclobenzaprine (FLEXERIL) 5 MG tablet Take 1 tablet (5 mg total) by mouth at bedtime as needed (pain). Do not drive as can cause drowsiness. 10/31/16   Marylene Land, NP  HYDROcodone-acetaminophen (NORCO/VICODIN) 5-325 MG tablet Take 1 tablet every 6 (six) hours as needed by mouth for moderate pain. 05/09/17   Little, Traci M, PA-C  JOLIVETTE 0.35 MG tablet Take 1 tablet by mouth daily. 11/05/14   [provider]  magic mouthwash w/lidocaine SOLN Take 5 mLs by mouth 3 (three) times daily as needed for mouth pain. 01/02/17   Marylene Land, NP  meloxicam (MOBIC) 15 MG tablet Take 1 tablet (15 mg total) by mouth daily as needed. 10/31/16   Marylene Land, NP  naproxen (NAPROSYN) 500 MG tablet Take 1 tablet (500 mg total) by mouth 2 (two) times daily with a meal. 08/27/16   Lorin Picket, PA-C  omeprazole (PRILOSEC) 20 MG capsule Take 20 mg by mouth daily.    [provider]  orphenadrine (NORFLEX) 100 MG tablet Take 1 tablet (100  mg total) 2 (two) times daily for 15 days by mouth. 05/09/17 05/24/17  Little, Traci M, PA-C  predniSONE (STERAPRED UNI-PAK 21 TAB) 10 MG (21) TBPK tablet Take 6 tablets on day 1. Take 5 tablets on day 2. Take 4 tablets on day 3. Take 3 tablets on day 4. Take 2 tablets on day 5. Take 1 tablets on day 6. 05/09/17   Little, Traci M, PA-C    Allergies Codeine; Morphine and related; and Tramadol  No family history on file.  Social History Social History   Tobacco Use  . Smoking status: Former Smoker    Packs/day: 0.25    Years: 17.00    Pack  years: 4.25  . Smokeless tobacco: Never Used  Substance Use Topics  . Alcohol use: No  . Drug use: No    Review of Systems Constitutional: Negative for fever/chills Eyes: No visual changes. Cardiovascular: Denies chest pain. Respiratory: Denies shortness of breath. Musculoskeletal:Postive for neck pain and stiffness.  Skin: Negative for rash. Neurological: Negative for headaches.  Negative focal weakness or numbness. Negative for loss of consciousness. Able to ambulate. ____________________________________________   PHYSICAL EXAM:  VITAL SIGNS: ED Triage Vitals  Enc Vitals Group     BP 05/09/17 1312 128/79     Pulse Rate 05/09/17 1312 90     Resp 05/09/17 1312 16     Temp 05/09/17 1312 98 F (36.7 C)     Temp Source 05/09/17 1312 Oral     SpO2 05/09/17 1312 99 %     Weight 05/09/17 1312 160 lb (72.6 kg)     Height 05/09/17 1312 5\' 5"  (1.651 m)     Head Circumference --      Peak Flow --      Pain Score 05/09/17 1311 10     Pain Loc --      Pain Edu? --      Excl. in [place]? --     Constitutional: Alert and oriented. Well appearing and in no acute distress.  Eyes: Conjunctivae are normal. PERRL. Head: Normocephalic and atraumatic. Neck:Supple. No thyromegaly. No stridor.  Cardiovascular: Normal rate, regular rhythm.  Good peripheral circulation. Respiratory: Normal respiratory effort without tachypnea or retractions.  Musculoskeletal: Cervical spine active range of motion limited by subjective pain and muscle stiffness.  Negative tenderness along cervical spinous processes.  Palpable tenderness along cervical spine paraspinals and suboccipital muscles. Neurologic: Normal speech and language.  Skin:  Skin is warm, dry and intact. No rash noted. Psychiatric: Mood and affect are normal. Speech and behavior are normal. Patient exhibits appropriate insight and judgement.  ____________________________________________   LABS (all labs ordered are listed, but only abnormal  results are displayed)  Labs Reviewed - No data to display ____________________________________________  EKG none ____________________________________________  RADIOLOGY DG Cervical spine  IMPRESSION: 1. No acute findings. 2. No significant degenerative change. 3. Scoliosis of the cervical and upper thoracic spine, measuring 15 degrees and 20 degrees respectively ____________________________________________   PROCEDURES  Procedure(s) performed: no    Critical Care performed: no ____________________________________________   INITIAL IMPRESSION / ASSESSMENT AND PLAN / ED COURSE  Pertinent labs & imaging results that were available during my care of the patient were reviewed by me and considered in my medical decision making (see chart for details).   Patient presents to emergency department with neck pain, stiffness and limited movement since awaking Saturday morning without injury. 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 Imaging was unremarkable  for acute fracture, dislocation.  Imaging noted for scoliosis.  Patient noted decreased symptoms following decadron and norflex given during the course of care in the emergency department. Patient will be prescribed prednisone and norflex. Patient advised to follow up with PCP as needed or return to the emergency department if symptoms return or worsen. Patient informed of clinical course, understand medical decision-making process, and agree with plan.   ____________________________________________   FINAL CLINICAL IMPRESSION(S) / ED DIAGNOSES  Final diagnoses:  Torticollis  Muscle spasms of neck  Neck pain       NEW MEDICATIONS STARTED DURING THIS VISIT:  This SmartLink is deprecated. Use AVSMEDLIST instead to display the medication list for a patient.   Note:  This document was prepared using Dragon voice recognition software and may include unintentional dictation  errors.    Alric Quan 05/09/17 1653    Schaevitz, Randall An, MD 05/10/17 Kathyrn Drown

## 2017-05-09 NOTE — ED Triage Notes (Signed)
Pt c/o waking with neck pain/stiffness since yesterday morning,. Denies injury/ states she has had similar pain in the past..

## 2019-05-12 ENCOUNTER — Ambulatory Visit: Payer: Medicaid Other

## 2019-05-12 ENCOUNTER — Other Ambulatory Visit: Payer: Self-pay

## 2019-05-12 ENCOUNTER — Encounter: Payer: Self-pay | Admitting: Emergency Medicine

## 2019-05-12 ENCOUNTER — Ambulatory Visit
Admission: EM | Admit: 2019-05-12 | Discharge: 2019-05-12 | Disposition: A | Discharge status: Home / Self Care | Payer: Medicaid Other | Attending: Emergency Medicine | Admitting: Emergency Medicine

## 2019-05-12 DIAGNOSIS — Z20822 Contact with and (suspected) exposure to covid-19: Secondary | ICD-10-CM

## 2019-05-12 DIAGNOSIS — J988 Other specified respiratory disorders: Secondary | ICD-10-CM

## 2019-05-12 DIAGNOSIS — B9789 Other viral agents as the cause of diseases classified elsewhere: Secondary | ICD-10-CM

## 2019-05-12 DIAGNOSIS — Z20828 Contact with and (suspected) exposure to other viral communicable diseases: Secondary | ICD-10-CM

## 2019-05-12 LAB — RAPID INFLUENZA A&B ANTIGENS
Influenza A (ARMC): NEGATIVE
Influenza B (ARMC): NEGATIVE

## 2019-05-12 LAB — PREGNANCY, URINE: Preg Test, Ur: NEGATIVE

## 2019-05-12 LAB — SARS CORONAVIRUS 2 AG (30 MIN TAT): SARS Coronavirus 2 Ag: NEGATIVE

## 2019-05-12 MED ORDER — ALBUTEROL SULFATE HFA 108 (90 BASE) MCG/ACT IN AERS: 1.0000 | INHALATION_SPRAY | Freq: Four times a day (QID) | RESPIRATORY_TRACT | 0 refills | Status: DC | PRN

## 2019-05-12 MED ORDER — BENZONATATE 200 MG PO CAPS: 200.0000 mg | ORAL_CAPSULE | Freq: Three times a day (TID) | ORAL | 0 refills | Status: DC | PRN

## 2019-05-12 MED ORDER — HYDROCOD POLST-CPM POLST ER 10-8 MG/5ML PO SUER: 5.0000 mL | Freq: Two times a day (BID) | ORAL | 0 refills | Status: DC | PRN

## 2019-05-12 MED ORDER — AEROCHAMBER PLUS MISC: 2 refills | Status: DC

## 2019-05-12 MED ORDER — IBUPROFEN 600 MG PO TABS: 600.0000 mg | ORAL_TABLET | Freq: Four times a day (QID) | ORAL | 0 refills | Status: AC | PRN

## 2019-05-12 MED ORDER — FLUTICASONE PROPIONATE 50 MCG/ACT NA SUSP: 2.0000 | Freq: Every day | NASAL | 0 refills | Status: DC

## 2019-05-12 NOTE — Discharge Instructions (Signed)
Your chest x-ray was negative for pneumonia.  Your pregnancy, flu and rapid Covid test were also negative.  I have sent off a formal Covid test that will take 24 to 48 hours for turnaround.  Isolate yourself until you know what the results are.  2 puffs from your albuterol inhaler using your spacer every 4-6 hours as needed for coughing, wheezing, shortness of breath.  Tessalon for the cough during the day, Tussionex for the cough at night.  Start Flonase, saline nasal irrigation with a Milta Deiters med sinus rinse and distilled water as often as you want, Mucinex D.  You can take ibuprofen 600 mg combined with 1 g of Tylenol 3-4 times a day as needed for pain, fever, headache, etc.

## 2019-05-12 NOTE — ED Triage Notes (Signed)
Patient c/o cough, chest congestion SOB and low grade fever that started 4 days ago.

## 2019-05-12 NOTE — ED Provider Notes (Signed)
HPI  SUBJECTIVE:  Kylie Curtis is a 38 y.o. female who presents with 5 days of greenish nasal congestion, rhinorrhea, postnasal drip, sore throat, body aches.  She reports a cough productive of thick green phlegm, wheezing, shortness of breath, dyspnea on exertion.  She states her chest feels tight from chest congestion.  She reports intermittent, minutes long right sided chest pain starting last night with no aggravating factors.  It seems to get a little bit better when she leans forward.  She reports sinus pain and pressure.  No upper dental pain, facial swelling.  No fevers, headaches.  No loss of sense of smell or taste.  No nausea, vomiting, diarrhea, abdominal pain.  No flu or Covid exposure.  She did not get a flu shot this year.  She has tried Alka-Seltzer cold and flu, Tylenol.  The Alka-Seltzer seems to help.  No aggravating factors.  No antipyretic in the past 4 to 6 hours.  No antibiotics in the past 3 months.  States that she is unable to sleep at night due to the cough.  Past medical history negative for asthma, emphysema, COPD, smoking, diabetes, chronic kidney disease, HIV, immunocompromise, hypertension.  LMP: 2 weeks ago.  Not sure if she could be pregnant.  ZOX:[id number], [facility]   Past Medical History:  Diagnosis Date  . Anxiety    Panic attacks  . Complication of anesthesia    took alot to get to sleep- with wisdom teeth  . Complication of anesthesia    woke up during hand surgery and could see them operating  . 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 Fibromyalgia   . GERD (gastroesophageal reflux disease)   . Headache    last one June 21,2016  . Hypoglycemia TMJ  . Pyelonephritis   . Shortness of breath dyspnea    with exertion    Past Surgical History:  Procedure Laterality Date  . INSERTION OF MESH N/A 12/18/2014   Procedure: INSERTION OF MESH;  Surgeon: Ralene Ok, MD;  Location: [facility];  Service: General;  Laterality: N/A;  .  LAPAROSCOPIC ABDOMINAL EXPLORATION    . right hand surgery Right    .Screws and graft- fracture  . TENDON REPAIR Right   . UMBILICAL HERNIA REPAIR N/A 12/18/2014   Procedure: LAPAROSCOPIC UMBILICAL HERNIA REPAIR WITH MESH;  Surgeon: Ralene Ok, MD;  Location: [facility];  Service: General;  Laterality: N/A;  . WISDOM TOOTH EXTRACTION      Family History  Problem Relation Age of Onset  . Healthy Mother   . Healthy Father     Social History   Tobacco Use  . Smoking status: Former Smoker    Packs/day: 0.25    Years: 17.00    Pack years: 4.25  . Smokeless tobacco: Never Used  Substance Use Topics  . Alcohol use: No  . Drug use: No    No current facility-administered medications for this encounter.   Current Outpatient Medications:  .  JOLIVETTE 0.35 MG tablet, Take 1 tablet by mouth daily., Disp: , Rfl: 9 .  omeprazole (PRILOSEC) 20 MG capsule, Take 20 mg by mouth daily., Disp: , Rfl:  .  albuterol (VENTOLIN HFA) 108 (90 Base) MCG/ACT inhaler, Inhale 1-2 puffs into the lungs every 6 (six) hours as needed for wheezing or shortness of breath., Disp: 18 g, Rfl: 0 .  benzonatate (TESSALON) 200 MG capsule, Take 1 capsule (200 mg total) by mouth 3 (three) times daily as  needed for cough., Disp: 30 capsule, Rfl: 0 .  chlorpheniramine-HYDROcodone (TUSSIONEX PENNKINETIC ER) 10-8 MG/5ML SUER, Take 5 mLs by mouth every 12 (twelve) hours as needed for cough., Disp: 60 mL, Rfl: 0 .  fluticasone (FLONASE) 50 MCG/ACT nasal spray, Place 2 sprays into both nostrils daily., Disp: 16 g, Rfl: 0 .  ibuprofen (ADVIL) 600 MG tablet, Take 1 tablet (600 mg total) by mouth every 6 (six) hours as needed., Disp: 30 tablet, Rfl: 0 .  Spacer/Aero-Holding Chambers (AEROCHAMBER PLUS) inhaler, Use as instructed, Disp: 1 each, Rfl: 2  Allergies  Allergen Reactions  . Codeine Itching  . Morphine And Related Itching  . Tramadol Itching     ROS  As noted in HPI.   Physical Exam  BP (!) 142/89 (BP  Location: Left Arm)   Pulse 93   Temp 98.3 F (36.8 C) (Oral)   Resp 16   Ht '5\' 5"'$  (1.651 m)   Wt 72.6 kg   LMP 04/28/2019 (Approximate) Comment: neg hcg  SpO2 98%   BMI 26.63 kg/m   Constitutional: Well developed, well nourished, no acute distress Eyes:  EOMI, conjunctiva normal bilaterally HENT: Normocephalic, atraumatic,mucus membranes moist clear nasal congestion.  Erythematous, swollen turbinates.  No sinus tenderness.  Normal tonsils without exudates.  Uvula midline.  Positive post nasal drip. Neck: No cervical lymphadenopathy Respiratory: Deep cough, normal inspiratory effort, lungs clear bilaterally.  No chest wall tenderness Cardiovascular: Normal rate regular rhythm no murmurs rubs or gallops GI: nondistended skin: No rash, skin intact Musculoskeletal: no deformities Neurologic: Alert & oriented x 3, no focal neuro deficits Psychiatric: Speech and behavior appropriate   ED Course   Medications - No data to display  Orders Placed This Encounter  Procedures  . SARS Coronavirus 2 Ag (30 min TAT) - Nasal Swab (BD Veritor Kit)    Standing Status:   Standing    Number of Occurrences:   1    Order Specific Question:   Is this test for diagnosis or screening    Answer:   Diagnosis of ill patient    Order Specific Question:   Symptomatic for COVID-19 as defined by CDC    Answer:   Yes    Order Specific Question:   Date of Symptom Onset    Answer:   05/09/2019    Order Specific Question:   Hospitalized for COVID-19    Answer:   No    Order Specific Question:   Admitted to ICU for COVID-19    Answer:   No    Order Specific Question:   Previously tested for COVID-19    Answer:   No    Order Specific Question:   Resident in a congregate (group) care setting    Answer:   No    Order Specific Question:   Employed in healthcare setting    Answer:   No    Order Specific Question:   Pregnant    Answer:   No  . Rapid Influenza A&B Antigens ([facility] only)    Standing Status:    Standing    Number of Occurrences:   1  . Novel Coronavirus, NAA (Hosp order, Send-out to Ref Lab; TAT 18-24 hrs    Standing Status:   Standing    Number of Occurrences:   1    Order Specific Question:   Is this test for diagnosis or screening    Answer:   Diagnosis of ill patient    Order Specific Question:  Symptomatic for COVID-19 as defined by CDC    Answer:   Yes    Order Specific Question:   Date of Symptom Onset    Answer:   05/08/2019    Order Specific Question:   Hospitalized for COVID-19    Answer:   Yes    Order Specific Question:   Admitted to ICU for COVID-19    Answer:   No    Order Specific Question:   Previously tested for COVID-19    Answer:   Yes    Order Specific Question:   Resident in a congregate (group) care setting    Answer:   No    Order Specific Question:   Employed in healthcare setting    Answer:   No    Order Specific Question:   Pregnant    Answer:   No  . DG Chest 2 View    Standing Status:   Standing    Number of Occurrences:   1    Order Specific Question:   Reason for Exam (SYMPTOM  OR DIAGNOSIS REQUIRED)    Answer:   r/o PNA  . Pregnancy, urine    Standing Status:   Standing    Number of Occurrences:   1  . Droplet precaution    Standing Status:   Standing    Number of Occurrences:   1    Results for orders placed or performed during the hospital encounter of 05/12/19 (from the past 24 hour(s))  SARS Coronavirus 2 Ag (30 min TAT) - Nasal Swab (BD Veritor Kit)     Status: None   Collection Time: 05/12/19  9:33 AM   Specimen: Nasal Swab (BD Veritor Kit)  Result Value Ref Range   SARS Coronavirus 2 Ag NEGATIVE NEGATIVE  Rapid Influenza A&B Antigens ([facility] only)     Status: None   Collection Time: 05/12/19 10:24 AM   Specimen: Flu Kit Nasopharyngeal Swab; Respiratory  Result Value Ref Range   Influenza A ([facility]) NEGATIVE NEGATIVE   Influenza B ([facility]) NEGATIVE NEGATIVE  Pregnancy, urine     Status: None   Collection Time: 05/12/19 10:24  AM  Result Value Ref Range   Preg Test, Ur NEGATIVE NEGATIVE   Dg Chest 2 View  Result Date: 05/12/2019 CLINICAL DATA:  Cough and fever EXAM: CHEST - 2 VIEW COMPARISON:  May 01, 2016 FINDINGS: Lungs are clear. Heart size and pulmonary vascularity are normal. No adenopathy. There is pectus excavatum. There is mild upper thoracic levoscoliosis. IMPRESSION: Lungs clear. Cardiac silhouette within normal limits. There is pectus excavatum. Electronically Signed   By: Lowella Grip III M.D.   On: 05/12/2019 11:20    ED Clinical Impression  1. Viral respiratory infection   2. Encounter for laboratory testing for COVID-19 virus      ED Assessment/Plan  Rapid Covid test negative.  Sending off formal Covid testing.  Checking flu, chest x-ray, urine pregnancy.  Flu negative.  Urine pregnancy negative.  [place] Narcotic database reviewed for this patient, and feel that the risk/benefit ratio today is favorable for proceeding with a prescription for controlled substance.  No opiate prescriptions in 2 years.  Reviewed imaging independently.  No pneumonia.  See radiology report for full details.  Presentation consistent with viral respiratory infection.  Could be Covid.  Will send home with Flonase, saline nasal irrigation, Mucinex D, Tessalon for the cough during the day, Tussionex for the cough at night, ibuprofen 600 mg combined with 1 g of Tylenol  3-4 times a day as needed for pain.  Also an albuterol inhaler with a spacer.  2 puffs every 4-6 hours as needed for chest tightness, wheezing, cough.  Follow-up with PMD in several days, to the ER if she gets worse.  Discussed labs, imaging, MDM, treatment plan, and plan for follow-up with patient. Discussed sn/sx that should prompt return to the ED. patient agrees with plan.   Meds ordered this encounter  Medications  . fluticasone (FLONASE) 50 MCG/ACT nasal spray    Sig: Place 2 sprays into both nostrils daily.    Dispense:  16 g    Refill:  0   . albuterol (VENTOLIN HFA) 108 (90 Base) MCG/ACT inhaler    Sig: Inhale 1-2 puffs into the lungs every 6 (six) hours as needed for wheezing or shortness of breath.    Dispense:  18 g    Refill:  0  . Spacer/Aero-Holding Chambers (AEROCHAMBER PLUS) inhaler    Sig: Use as instructed    Dispense:  1 each    Refill:  2  . chlorpheniramine-HYDROcodone (TUSSIONEX PENNKINETIC ER) 10-8 MG/5ML SUER    Sig: Take 5 mLs by mouth every 12 (twelve) hours as needed for cough.    Dispense:  60 mL    Refill:  0  . benzonatate (TESSALON) 200 MG capsule    Sig: Take 1 capsule (200 mg total) by mouth 3 (three) times daily as needed for cough.    Dispense:  30 capsule    Refill:  0  . ibuprofen (ADVIL) 600 MG tablet    Sig: Take 1 tablet (600 mg total) by mouth every 6 (six) hours as needed.    Dispense:  30 tablet    Refill:  0    *This clinic note was created using [employer]. Therefore, there may be occasional mistakes despite careful proofreading.   ?    Melynda Ripple, MD 05/12/19 1306

## 2019-05-13 LAB — NOVEL CORONAVIRUS, NAA (HOSP ORDER, SEND-OUT TO REF LAB; TAT 18-24 HRS): SARS-CoV-2, NAA: NOT DETECTED

## 2020-04-30 ENCOUNTER — Ambulatory Visit: Payer: Self-pay

## 2020-04-30 ENCOUNTER — Other Ambulatory Visit: Payer: Self-pay

## 2020-04-30 ENCOUNTER — Ambulatory Visit
Admission: EM | Admit: 2020-04-30 | Discharge: 2020-04-30 | Disposition: A | Discharge status: Home / Self Care | Payer: Medicaid Other

## 2020-04-30 DIAGNOSIS — R5383 Other fatigue: Secondary | ICD-10-CM

## 2020-04-30 DIAGNOSIS — R221 Localized swelling, mass and lump, neck: Secondary | ICD-10-CM

## 2020-04-30 DIAGNOSIS — R6889 Other general symptoms and signs: Secondary | ICD-10-CM

## 2020-04-30 DIAGNOSIS — E041 Nontoxic single thyroid nodule: Secondary | ICD-10-CM

## 2020-04-30 LAB — CBC WITH DIFFERENTIAL/PLATELET
Abs Immature Granulocytes: 0.03 10*3/uL (ref 0.00–0.07)
Basophils Absolute: 0.1 10*3/uL (ref 0.0–0.1)
Basophils Relative: 1 %
Eosinophils Absolute: 0.3 10*3/uL (ref 0.0–0.5)
Eosinophils Relative: 3 %
HCT: 43.7 % (ref 36.0–46.0)
Hemoglobin: 14.3 g/dL (ref 12.0–15.0)
Immature Granulocytes: 0 %
Lymphocytes Relative: 43 %
Lymphs Abs: 4.1 10*3/uL — ABNORMAL HIGH (ref 0.7–4.0)
MCH: 28.7 pg (ref 26.0–34.0)
MCHC: 32.7 g/dL (ref 30.0–36.0)
MCV: 87.6 fL (ref 80.0–100.0)
Monocytes Absolute: 0.7 10*3/uL (ref 0.1–1.0)
Monocytes Relative: 7 %
Neutro Abs: 4.3 10*3/uL (ref 1.7–7.7)
Neutrophils Relative %: 46 %
Platelets: 280 10*3/uL (ref 150–400)
RBC: 4.99 MIL/uL (ref 3.87–5.11)
RDW: 13.3 % (ref 11.5–15.5)
WBC: 9.5 10*3/uL (ref 4.0–10.5)
nRBC: 0 % (ref 0.0–0.2)

## 2020-04-30 NOTE — ED Provider Notes (Addendum)
[facility]    CSN: [id number] Arrival date & time: 04/30/20  1142      History   Chief Complaint Chief Complaint  Patient presents with  . Mass    throat/anterior neck    HPI Kylie Curtis is a 39 y.o. female presenting for 13-day history of swelling to the right lower anterior neck.  She says the area is nontender.  Denies any recent illness.  Denies any sore throat or difficulty swallowing.  No fevers.  Patient denies any headaches, cough, congestion, ear pain.  No breathing difficulty or chest discomfort.  No swelling elsewhere.  Denies any similar history.  Patient states that her mother had a goiter in her 12s and patient concerned about her thyroid.  Patient does admit to chronic fatigue and cold intolerance.  She denies any hair loss, weight changes or appetite changes, mood problems, chest pain or palpitations, body aches, constipation or diarrhea.  No other concerns today.  HPI  Past Medical History:  Diagnosis Date  . Anxiety    Panic attacks  . Complication of anesthesia    took alot to get to sleep- with wisdom teeth  . Complication of anesthesia    woke up during hand surgery and could see them operating  . 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 Fibromyalgia   . GERD (gastroesophageal reflux disease)   . Headache    last one June 21,2016  . Hypoglycemia TMJ  . Pyelonephritis   . Shortness of breath dyspnea    with exertion    There are no problems to display for this patient.   Past Surgical History:  Procedure Laterality Date  . INSERTION OF MESH N/A 12/18/2014   Procedure: INSERTION OF MESH;  Surgeon: Ralene Ok, MD;  Location: [facility];  Service: General;  Laterality: N/A;  . LAPAROSCOPIC ABDOMINAL EXPLORATION    . right hand surgery Right    .Screws and graft- fracture  . TENDON REPAIR Right   . UMBILICAL HERNIA REPAIR N/A 12/18/2014   Procedure: LAPAROSCOPIC UMBILICAL HERNIA REPAIR WITH MESH;  Surgeon: Ralene Ok, MD;   Location: [facility];  Service: General;  Laterality: N/A;  . WISDOM TOOTH EXTRACTION      OB History   No obstetric history on file.      Home Medications    Prior to Admission medications   Medication Sig Start Date End Date Taking? Authorizing Provider  Multiple Vitamin (MULTIVITAMIN WITH MINERALS) TABS tablet Take 1 tablet by mouth daily.   Yes [provider]  Turmeric 1053 MG TABS Take 1 tablet by mouth.   Yes [provider]  albuterol (VENTOLIN HFA) 108 (90 Base) MCG/ACT inhaler Inhale 1-2 puffs into the lungs every 6 (six) hours as needed for wheezing or shortness of breath. 05/12/19   Melynda Ripple, MD  benzonatate (TESSALON) 200 MG capsule Take 1 capsule (200 mg total) by mouth 3 (three) times daily as needed for cough. 05/12/19   Melynda Ripple, MD  chlorpheniramine-HYDROcodone (TUSSIONEX PENNKINETIC ER) 10-8 MG/5ML SUER Take 5 mLs by mouth every 12 (twelve) hours as needed for cough. 05/12/19   Melynda Ripple, MD  fluticasone (FLONASE) 50 MCG/ACT nasal spray Place 2 sprays into both nostrils daily. 05/12/19   Melynda Ripple, MD  ibuprofen (ADVIL) 600 MG tablet Take 1 tablet (600 mg total) by mouth every 6 (six) hours as needed. 05/12/19   Melynda Ripple, MD  JOLIVETTE 0.35 MG tablet Take 1 tablet by mouth daily.  11/05/14   [provider]  omeprazole (PRILOSEC) 20 MG capsule Take 20 mg by mouth daily.    [provider]  Spacer/Aero-Holding Chambers (AEROCHAMBER PLUS) inhaler Use as instructed 05/12/19   Melynda Ripple, MD    Family History Family History  Problem Relation Age of Onset  . Hyperlipidemia Mother   . Hypertension Mother   . Hyperlipidemia Father   . Hypertension Father   . Stroke Father     Social History Social History   Tobacco Use  . Smoking status: Former Smoker    Packs/day: 0.25    Years: 17.00    Pack years: 4.25  . Smokeless tobacco: Never Used  Vaping Use  . Vaping Use: Never used    Substance Use Topics  . Alcohol use: No  . Drug use: No     Allergies   Codeine, Morphine and related, and Tramadol   Review of Systems Review of Systems  Constitutional: Positive for fatigue. Negative for appetite change, fever and unexpected weight change.  HENT: Negative for congestion, ear pain, rhinorrhea, sinus pressure, sinus pain and sore throat.   Respiratory: Negative for cough and shortness of breath.   Cardiovascular: Negative for chest pain and palpitations.  Gastrointestinal: Negative for abdominal pain, nausea and vomiting.  Endocrine: Positive for cold intolerance.  Musculoskeletal: Negative for arthralgias and myalgias.       Neck swelling  Skin: Negative for rash.  Neurological: Negative for dizziness, weakness and headaches.     Physical Exam Triage Vital Signs ED Triage Vitals  Enc Vitals Group     BP 04/30/20 1230 136/89     Pulse Rate 04/30/20 1230 80     Resp 04/30/20 1230 18     Temp 04/30/20 1230 99.1 F (37.3 C)     Temp Source 04/30/20 1230 Oral     SpO2 04/30/20 1230 100 %     Weight 04/30/20 1224 170 lb (77.1 kg)     Height --      Head Circumference --      Peak Flow --      Pain Score 04/30/20 1224 0     Pain Loc --      Pain Edu? --      Excl. in [place]? --    No data found.  Updated Vital Signs BP 136/89 (BP Location: Left Arm)   Pulse 80   Temp 99.1 F (37.3 C) (Oral)   Resp 18   Wt 170 lb (77.1 kg)   LMP 04/27/2020   SpO2 100%   BMI 28.29 kg/m    Physical Exam Vitals and nursing note reviewed.  Constitutional:      General: She is not in acute distress.    Appearance: Normal appearance. She is not ill-appearing or toxic-appearing.  HENT:     Head: Normocephalic and atraumatic.     Nose: Nose normal.     Mouth/Throat:     Mouth: Mucous membranes are moist.     Pharynx: Oropharynx is clear.  Eyes:     General: No scleral icterus.       Right eye: No discharge.        Left eye: No discharge.      Conjunctiva/sclera: Conjunctivae normal.  Neck:     Thyroid: Thyroid mass (enlargement right aspect of thyroid) present.  Cardiovascular:     Rate and Rhythm: Normal rate and regular rhythm.     Heart sounds: Normal heart sounds.  Pulmonary:  Effort: Pulmonary effort is normal. No respiratory distress.     Breath sounds: Normal breath sounds.  Musculoskeletal:     Cervical back: Normal range of motion and neck supple. No rigidity or tenderness. No spinous process tenderness.  Lymphadenopathy:     Cervical:     Right cervical: No posterior cervical adenopathy.    Left cervical: No posterior cervical adenopathy.  Skin:    General: Skin is dry.  Neurological:     General: No focal deficit present.     Mental Status: She is alert. Mental status is at baseline.     Motor: No weakness.     Gait: Gait normal.  Psychiatric:        Mood and Affect: Mood normal.        Behavior: Behavior normal.        Thought Content: Thought content normal.      UC Treatments / Results  Labs (all labs ordered are listed, but only abnormal results are displayed) Labs Reviewed  CBC WITH DIFFERENTIAL/PLATELET - Abnormal; Notable for the following components:      Result Value   Lymphs Abs 4.1 (*)    All other components within normal limits  THYROID PANEL WITH TSH - Abnormal; Notable for the following components:   T3 Uptake Ratio 16 (*)    All other components within normal limits    EKG   Radiology US THYROID  Result Date: 05/07/2020 CLINICAL DATA:  39 year old female with a history of neck mass EXAM: THYROID ULTRASOUND TECHNIQUE: Ultrasound examination of the thyroid gland and adjacent soft tissues was performed. COMPARISON:  None. FINDINGS: Parenchymal Echotexture: Mildly heterogenous Isthmus: 0.3 cm Right lobe: 5.6 cm x 2.4 cm x 3.0 cm Left lobe: 4.2 cm x 0.9 cm x 1.3 cm _________________________________________________________ Estimated total number of nodules >/= 1 cm: 1 Number of  spongiform nodules >/=  2 cm not described below (TR1): 0 Number of mixed cystic and solid nodules >/= 1.5 cm not described below (TR2): 0 _________________________________________________________ Nodule # 1: Location: Right; Mid Maximum size: 3.3 cm; Other 2 dimensions: 2.3 cm x 1.9 cm Composition: solid/almost completely solid (2) Echogenicity: isoechoic (1) Shape: not taller-than-wide (0) Margins: smooth (0) Echogenic foci: none (0) ACR TI-RADS total points: 3. ACR TI-RADS risk category: TR3 (3 points). ACR TI-RADS recommendations: Nodule meets criteria for biopsy _________________________________________________________ Additional nodules are present, none of which meet criteria for surveillance or biopsy. No adenopathy IMPRESSION: Right mid thyroid nodule meets criteria for biopsy, as designated by the newly established ACR TI-RADS criteria, and referral for biopsy is recommended. Recommendations follow those established by the new ACR TI-RADS criteria (J Am Coll Radiol 7412;87:867-672). Electronically Signed   By: Corrie Mckusick D.O.   On: 05/07/2020 10:15    Procedures Procedures (including critical care time)  Medications Ordered in UC Medications - No data to display  Initial Impression / Assessment and Plan / UC Course  I have reviewed the triage vital signs and the nursing notes.  Pertinent labs & imaging results that were available during my care of the patient were reviewed by me and considered in my medical decision making (see chart for details).   39 year old female presenting with a mass of the anterior neck.  On exam, this is most present over the right side of the thyroid gland.  It is nontender and no erythema.  CBC obtained which is within normal limits today.  Thyroid panel also obtained.  Advised patient we will call with results.  Concerns are for cyst of thyroid, lymphadenopathy, thyroid mass/goiter  Sending patient for ultrasound of the thyroid at this time.  Advised I will  call her with results and if abnormal  will refer her to endocrinology.  If findings are inconclusive, advised her she will need to establish with a PCP to have further work-up.  Patient is agreeable.  Final Clinical Impressions(s) / UC Diagnoses   Final diagnoses:  Neck mass  Fatigue, unspecified type  Cold intolerance  Localized swelling, mass and lump, neck  Thyroid nodule     Discharge Instructions     I will call you with the results of your labs either later today or tomorrow.  I have placed an order for an ultrasound they will need to go to [facility] for the ultrasound of your thyroid and neck.  We will discuss results once they come in.  If there is a thyroid abnormality, we can refer you to endocrinology.  If everything checks out and normal, you need to establish with a PCP.   Your appointment for ultrasound is for 05/07/20 at 9:30am at [facility]. Please arrive by 9:15 am.  [facility] at [facility], [place], [address]. Phone number: [phone] Please call this number to establish with primary care     ED Prescriptions    None     PDMP not reviewed this encounter.   Danton Clap, PA-C 04/30/20 1334    Laurene Footman B, PA-C 05/08/20 1153

## 2020-04-30 NOTE — ED Notes (Signed)
Scheduled u/s for 05/07/20 @ 9:30 am at [facility]

## 2020-04-30 NOTE — Discharge Instructions (Signed)
I will call you with the results of your labs either later today or tomorrow.  I have placed an order for an ultrasound they will need to go to [facility] for the ultrasound of your thyroid and neck.  We will discuss results once they come in.  If there is a thyroid abnormality, we can refer you to endocrinology.  If everything checks out and normal, you need to establish with a PCP.   Your appointment for ultrasound is for 05/07/20 at 9:30am at [facility] Please arrive by 9:15 am.  [facility] at [facility], [place], [address]. Phone number: [phone] Please call this number to establish with primary care

## 2020-04-30 NOTE — ED Triage Notes (Addendum)
Pt presents to Urgent Care with c/o "lump" to throat/R anterior neck. Reports she noticed this on 04/17/20. Pt states she hasn't noticed a change in size, but reports increased firmness to area. Denies pain.

## 2020-05-01 LAB — THYROID PANEL WITH TSH
Free Thyroxine Index: 1.3 (ref 1.2–4.9)
T3 Uptake Ratio: 16 % — ABNORMAL LOW (ref 24–39)
T4, Total: 8.2 ug/dL (ref 4.5–12.0)
TSH: 1.13 u[IU]/mL (ref 0.450–4.500)

## 2020-05-07 ENCOUNTER — Ambulatory Visit
Admission: RE | Admit: 2020-05-07 | Discharge: 2020-05-07 | Disposition: A | Discharge status: Home / Self Care | Payer: Self-pay | Source: Ambulatory Visit | Attending: Physician Assistant | Admitting: Physician Assistant

## 2020-05-07 ENCOUNTER — Other Ambulatory Visit: Payer: Self-pay

## 2020-05-07 DIAGNOSIS — E041 Nontoxic single thyroid nodule: Secondary | ICD-10-CM | POA: Insufficient documentation

## 2020-05-08 ENCOUNTER — Telehealth: Payer: Self-pay | Admitting: Physician Assistant

## 2020-05-08 NOTE — Telephone Encounter (Signed)
I reviewed the results of patient's thyroid labs and thyroid ultrasound.  Thyroid ultrasound does show mildly suspicious nodule.  Discussed this with patient and advised her that she will need to follow-up with endocrinology.  I called [facility] endocrinology and was given the fax number for referral.  I sent patients notes and lab results as well as thyroid ultrasound results to prior to clinic endocrinology and asked them to call her for an appointment.

## 2020-05-14 NOTE — Telephone Encounter (Signed)
Pt called back about her referral tin endocrinology. Apparently [facility] endocrinology did not receive referral for pt. Christene Slates, PA saved papers and fax was successful but will refax paperwork.

## 2020-07-02 ENCOUNTER — Other Ambulatory Visit: Payer: Self-pay

## 2020-07-02 ENCOUNTER — Telehealth: Payer: Self-pay | Admitting: General Surgery

## 2020-07-02 ENCOUNTER — Ambulatory Visit (INDEPENDENT_AMBULATORY_CARE_PROVIDER_SITE_OTHER): Payer: Self-pay | Admitting: General Surgery

## 2020-07-02 ENCOUNTER — Encounter: Payer: Self-pay | Admitting: General Surgery

## 2020-07-02 VITALS — BP 132/84 | HR 102 | Temp 98.3°F | Ht 65.0 in | Wt 177.6 lb

## 2020-07-02 DIAGNOSIS — E041 Nontoxic single thyroid nodule: Secondary | ICD-10-CM

## 2020-07-02 DIAGNOSIS — S0300XA Dislocation of jaw, unspecified side, initial encounter: Secondary | ICD-10-CM | POA: Insufficient documentation

## 2020-07-02 NOTE — Patient Instructions (Addendum)
Dr.Cannon discussed treatment option, risk factors and recovery time with patient at today's visit. Our surgery scheduler Pamala Hurry will contact you within the next 24-48 hours to discuss the preparation prior to surgery and also discuss the times to align with your schedule. Please have the BLUE sheet when she contacts you. If you have any questions or concerns, please do not hesitate to give our office a call.  Thyroidectomy A thyroidectomy is a surgery that is done to remove the thyroid gland. The thyroid is a butterfly-shaped gland that is located at the lower front of your neck. It produces thyroid hormone, which is a substance that helps to control certain body processes. You may have a:  Total thyroidectomy. All of your thyroid is removed.  Thyroid lobectomy. Part of your thyroid is removed. The amount of thyroid gland tissue that is removed during your surgery depends on the reason for the procedure. Reasons to have this procedure include treatment for:  Thyroid nodules.  Thyroid cancer.  Benign thyroid tumors.  Goiter.  Overactive thyroid gland (hyperthyroidism). There are two ways to do this procedure. Conventional, or open, thyroidectomy uses one large incision to remove the thyroid gland. This is the most common method. Endoscopic thyroidectomy, a less invasive method, uses a narrow tube with a light and camera (endoscope) to remove the gland. Tell a health care provider about:  Any allergies you have.  All medicines you are taking, including vitamins, herbs, eye drops, creams, and over-the-counter medicines.  Any problems you or family members have had with anesthetic medicines.  Any blood disorders you have.  Any surgeries you have had.  Any medical conditions you have.  Whether you are pregnant or may be pregnant. What are the risks? Generally, this is a safe procedure. However, problems may occur, including:  Damage to the parathyroid glands. These are located  behind your thyroid gland. They maintain the calcium levels in the body. Damage may lead to: ? A decrease in parathyroid hormone levels (hypoparathyroidism). ? A decrease in calcium levels. This will make your nerves irritable and may cause muscle spasms.  An increase in thyroid hormone.  Damage to the nerves of your voice box (larynx). This can be temporary or long-term (rare).  Hoarseness. This usually resolves in 24-48 hours.  Bleeding.  Infection. What happens before the procedure? Staying hydrated Follow instructions from your health care provider about hydration, which may include:  Up to 2 hours before the procedure - you may continue to drink clear liquids, such as water, clear fruit juice, black coffee, and plain tea. Eating and drinking restrictions Follow instructions from your health care provider about eating and drinking, which may include:  8 hours before the procedure - stop eating heavy meals or foods such as meat, fried foods, or fatty foods.  6 hours before the procedure - stop eating light meals or foods, such as toast or cereal.  6 hours before the procedure - stop drinking milk or drinks that contain milk.  2 hours before the procedure - stop drinking clear liquids. Medicines Ask your health care provider about:  Changing or stopping your regular medicines. This is especially important if you are taking diabetes medicines or blood thinners.  Taking medicines such as aspirin and ibuprofen. These medicines can thin your blood. Do not take these medicines unless your health care provider tells you to take them.  Taking over-the-counter medicines, vitamins, herbs, and supplements. General instructions  You may be asked to shower with a germ-killing soap.  Plan to have someone take you home from the hospital or clinic.  Plan to have a responsible adult care for you for at least 24 hours after you leave the hospital or clinic. This is important. What happens  during the procedure?  To reduce your risk of infection: ? Your health care team will wash or sanitize their hands. ? Hair may be removed from the surgical area. ? Your skin will be washed with soap.  An IV will be inserted into one of your veins.  You will be given one or more of the following: ? A medicine to help you relax (sedative). ? A medicine to make you fall asleep (general anesthetic).  Your health care provider will perform your surgery using one of two methods: ? For open thyroidectomy, an incision will be made in your lower neck. Muscles in the area will be separated to reveal your thyroid gland. ? For endoscopic thyroidectomy, several small incisions will be made in your neck, chest, or armpit. An endoscopewill be inserted into an incision.  Your health care provider may monitor laryngeal nerve function during the procedure for safety reasons.  Part or all of your thyroid gland will be removed.  A tube (drain) may be placed at the incision site to drain blood and fluids that accumulate under the skin after the procedure. The drain may have to stay in place for a day or two after the procedure.  The incision will be closed with stitches (sutures).  A dressing will be placed over your incision. The procedure may vary among health care providers and hospitals. What happens after the procedure?  Your blood pressure, heart rate, breathing rate, and blood oxygen level will be monitored often until the medicines you were given have worn off.  You will be given pain medicine as needed.  Your provider will check your ability to talk and swallow after the procedure.  You will gradually start to drink liquids and have soft foods as tolerated.  You may have a blood test to check the level of calcium in your body.  If you had a drain put in during the procedure, it will usually be removed the next day. Summary  A thyroidectomy is a surgery that is done to remove the thyroid  gland.  The procedure will be done in one of two ways: conventional, or open, thyroidectomy or endoscopic thyroidectomy.  Serious complications are rare.  Plan to have a responsible adult care for you for at least 24 hours after you leave the hospital or clinic. This is important. This information is not intended to replace advice given to you by your health care provider. Make sure you discuss any questions you have with your health care provider. Document Revised: 02/15/2020 Document Reviewed: 02/15/2020 Elsevier Patient Education  [place].

## 2020-07-02 NOTE — Telephone Encounter (Signed)
Error

## 2020-07-02 NOTE — Progress Notes (Signed)
Patient ID: Kylie Curtis, female   DOB: July 13, 1980, 40 y.o.   MRN: [id number]  Chief Complaint  Patient presents with  . Other    new pt ref Dr.Melissa Solum eval for thyroidectomy    HPI Kylie Curtis is a 40 y.o. female.   She has been referred by Dr. Gabriel Carina to discuss thyroidectomy.  Kylie Curtis states that she first noticed a lump in her neck in November and presented to the urgent care facility in [facility] for evaluation.  A thyroid ultrasound was done on November 16 that showed a dominant 3.3 cm solid thyroid nodule.  She was subsequently referred to Dr. Gabriel Carina, who performed a fine-needle aspiration biopsy on June 05, 2020.  The pathology report from [facility] was Bethesda category 4, suspicious for follicular neoplasm.  According to Dr. Joycie Peek notes, GEC was suspicious, but the [facility] expression atlas was negative; unfortunately, it appears that the copy of the report sent to me did not include those results but is only a preliminary, with the cytology on it.  Of note, the microscopic description reports "the cytologic and cell block preparations are markedly cellular, with numerous microfollicles, trabecular arrangements, and/or some macro follicles; follicular cells are enlarged and crowded with nuclear molding and chromatin changes.  There is relatively little colloid present."  As a result of these findings, Kylie Curtis was referred for surgical management.  She reports that her mother had a goiter that required thyroidectomy in her 34s.  She endorses frequent throat clearing, pressure in her neck while in a supine position, some dysphagia (described as noticeable sensation when swallowing), occasional voice cracking, cold sensitivity, heart palpitations and feeling jittery and anxious.  She denies any changes in the texture of her hair, skin, or fingernails.  She has occasional diarrhea, but normally moves her bowels about every 2 to 3 days.  She does endorse fatigue and states that she  has gained about 50 pounds over 2-1/2 years.  No family history of thyroid cancer.  No personal history of occupational or therapeutic exposure to ionizing radiation.   Past Medical History:  Diagnosis Date  . Anxiety    Panic attacks  . Complication of anesthesia    took alot to get to sleep- with wisdom teeth  . Complication of anesthesia    woke up during hand surgery and could see them operating  . 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 Fibromyalgia   . GERD (gastroesophageal reflux disease)   . Headache    last one June 21,2016  . Hypoglycemia TMJ  . Pyelonephritis   . Shortness of breath dyspnea    with exertion    Past Surgical History:  Procedure Laterality Date  . INSERTION OF MESH N/A 12/18/2014   Procedure: INSERTION OF MESH;  Surgeon: Ralene Ok, MD;  Location: [place];  Service: General;  Laterality: N/A;  . LAPAROSCOPIC ABDOMINAL EXPLORATION    . right hand surgery Right    .Screws and graft- fracture  . TENDON REPAIR Right   . UMBILICAL HERNIA REPAIR N/A 12/18/2014   Procedure: LAPAROSCOPIC UMBILICAL HERNIA REPAIR WITH MESH;  Surgeon: Ralene Ok, MD;  Location: [place];  Service: General;  Laterality: N/A;  . WISDOM TOOTH EXTRACTION      Family History  Problem Relation Age of Onset  . Hyperlipidemia Mother   . Hypertension Mother   . Hyperlipidemia Father   . Hypertension Father   . Stroke Father     Social History Social  History   Tobacco Use  . Smoking status: Former Smoker    Packs/day: 0.25    Years: 17.00    Pack years: 4.25  . Smokeless tobacco: Never Used  Vaping Use  . Vaping Use: Never used  Substance Use Topics  . Alcohol use: No  . Drug use: No    Allergies  Allergen Reactions  . Codeine Itching  . Morphine And Related Itching  . Tramadol Itching  . Other Itching and Rash    Current Outpatient Medications  Medication Sig Dispense Refill  . Cholecalciferol (VITAMIN D3 PO) Take by mouth.    . Cyanocobalamin (B-12 SL)  Place under the tongue.    Marland Kitchen. ibuprofen (ADVIL) 600 MG tablet Take 1 tablet (600 mg total) by mouth every 6 (six) hours as needed. 30 tablet 0  . JOLIVETTE 0.35 MG tablet Take 1 tablet by mouth daily.  9  . Multiple Vitamin (MULTIVITAMIN WITH MINERALS) TABS tablet Take 1 tablet by mouth daily.    . Multiple Vitamins-Minerals (ZINC PO) Take by mouth.    Marland Kitchen. omeprazole (PRILOSEC) 20 MG capsule Take 20 mg by mouth daily.    . Turmeric 1053 MG TABS Take 1 tablet by mouth.    Marland Kitchen. albuterol (VENTOLIN HFA) 108 (90 Base) MCG/ACT inhaler Inhale 1-2 puffs into the lungs every 6 (six) hours as needed for wheezing or shortness of breath. 18 g 0  . benzonatate (TESSALON) 200 MG capsule Take 1 capsule (200 mg total) by mouth 3 (three) times daily as needed for cough. 30 capsule 0  . chlorpheniramine-HYDROcodone (TUSSIONEX PENNKINETIC ER) 10-8 MG/5ML SUER Take 5 mLs by mouth every 12 (twelve) hours as needed for cough. 60 mL 0  . fluticasone (FLONASE) 50 MCG/ACT nasal spray Place 2 sprays into both nostrils daily. 16 g 0  . Spacer/Aero-Holding Chambers (AEROCHAMBER PLUS) inhaler Use as instructed 1 each 2   No current facility-administered medications for this visit.    Review of Systems Review of Systems  Gastrointestinal: Positive for diarrhea and nausea.  Neurological: Positive for headaches.  All other systems reviewed and are negative. Or as discussed in the history of present illness.  Blood pressure 132/84, pulse (!) 102, temperature 98.3 F (36.8 C), temperature source Oral, height 5\' 5"  (1.651 m), weight 177 lb 9.6 oz (80.6 kg), SpO2 98 %.  Physical Exam Physical Exam Constitutional:      General: She is not in acute distress.    Appearance: She is obese.  HENT:     Head: Normocephalic and atraumatic.     Nose:     Comments: Covered with a mask Eyes:     General: No scleral icterus.       Right eye: No discharge.        Left eye: No discharge.     Conjunctiva/sclera: Conjunctivae normal.      Comments: No proptosis or exophthalmos.  No lid lag or stare.  Neck:     Thyroid: Thyroid mass present.     Trachea: No tracheal deviation.     Comments: There is a large palpable nodule within the right lobe of the thyroid.  It is smoothly contoured and rubbery in texture.  The gland moves freely with deglutition. Cardiovascular:     Rate and Rhythm: Regular rhythm. Tachycardia present.     Pulses: Normal pulses.     Heart sounds: No murmur heard.   Pulmonary:     Effort: Pulmonary effort is normal. No respiratory distress.  Breath sounds: Normal breath sounds.  Abdominal:     General: Bowel sounds are normal.     Palpations: Abdomen is soft.  Genitourinary:    Comments: Deferred Musculoskeletal:        General: No swelling, tenderness or deformity.     Cervical back: Normal range of motion and neck supple.  Lymphadenopathy:     Cervical: No cervical adenopathy.  Skin:    General: Skin is warm and dry.  Neurological:     General: No focal deficit present.     Mental Status: She is alert and oriented to person, place, and time.  Psychiatric:        Mood and Affect: Mood normal.        Behavior: Behavior normal.     Data Reviewed I personally reviewed the thyroid ultrasound images.  There are some additional small nodules within the thyroid parenchyma which do not meet criteria for biopsy.  The majority of these are on the right, but there is a left inferior pole nodule seen.  I concur with the radiologist's impression that is copied here:  CLINICAL DATA:  40 year old female with a history of neck mass  EXAM: THYROID ULTRASOUND  TECHNIQUE: Ultrasound examination of the thyroid gland and adjacent soft tissues was performed.  COMPARISON:  None.  FINDINGS: Parenchymal Echotexture: Mildly heterogenous  Isthmus: 0.3 cm  Right lobe: 5.6 cm x 2.4 cm x 3.0 cm  Left lobe: 4.2 cm x 0.9 cm x 1.3  cm  _________________________________________________________  Estimated total number of nodules >/= 1 cm: 1  Number of spongiform nodules >/=  2 cm not described below (TR1): 0  Number of mixed cystic and solid nodules >/= 1.5 cm not described below (TR2): 0  _________________________________________________________  Nodule # 1:  Location: Right; Mid  Maximum size: 3.3 cm; Other 2 dimensions: 2.3 cm x 1.9 cm  Composition: solid/almost completely solid (2)  Echogenicity: isoechoic (1)  Shape: not taller-than-wide (0)  Margins: smooth (0)  Echogenic foci: none (0)  ACR TI-RADS total points: 3.  ACR TI-RADS risk category: TR3 (3 points).  ACR TI-RADS recommendations:  Nodule meets criteria for biopsy  _________________________________________________________  Additional nodules are present, none of which meet criteria for surveillance or biopsy.  No adenopathy  IMPRESSION: Right mid thyroid nodule meets criteria for biopsy, as designated by the newly established ACR TI-RADS criteria, and referral for biopsy is recommended.  Recommendations follow those established by the new ACR TI-RADS criteria (J Am Coll Radiol 8295;62:130-865).   Electronically Signed   By: Corrie Mckusick D.O.   On: 05/07/2020 10:15  Thyroid function studies were performed at her urgent care visit on November 9.  These are copied here and demonstrate essentially normal thyroid function. Results for Kylie, Curtis (MRN [id number]) as of 07/02/2020 10:48  Ref. Range 04/30/2020 12:57  TSH Latest Ref Range: 0.450 - 4.500 uIU/mL 1.130  Thyroxine (T4) Latest Ref Range: 4.5 - 12.0 ug/dL 8.2  Free Thyroxine Index Latest Ref Range: 1.2 - 4.9  1.3  T3 Uptake Ratio Latest Ref Range: 24 - 39 % 16 (L)   I reviewed Dr. Joycie Peek clinic notes regarding this patient, including her recommendation for total thyroidectomy to facilitate potential radioactive iodine ablation, should  it be necessary.  I also reviewed the [facility] results provided to me by Dr. Joycie Peek office and summarized in the history of present illness.  Assessment This is a 40 year old woman with a right thyroid nodule suspicious by [facility] criteria.  This conveys roughly a 50% risk of malignancy.  I have recommended that she undergo surgical resection.  I discussed the pros and cons, as well as the risks and benefits of total thyroidectomy versus thyroid lobectomy.  After reviewing all of these options, despite Dr. Joycie Peek recommendation and my personal feeling that this is most likely a malignant lesion based upon the cytology result and appearance on ultrasound, Kylie Curtis stated that she would prefer to take her chances at needing a second surgery and would rather undergo a thyroid lobectomy upfront.  I did let her know that if my intraoperative findings included involved nodes or obvious extrathyroidal extension, I would proceed with total thyroidectomy at the time of surgery.  Plan The risks of thyroid surgery were discussed, including (but not limited to): bleeding, infection, damage to surrounding structures/tissues, injury (temporary or permanent) to the recurrent laryngeal nerve, hypoparathyroidism (temporary or permanent), need for thyroid hormone replacement therapy, need for additional surgery and/or treatment, recurrence of disease, tracheostomy (temporary or permanent).  The patient had the opportunity to ask any questions and these were answered to their satisfaction.  As this lesion is suspicious for cancer, I believe we can make her a priority 1 for operative intervention and we will work on getting her scheduled.    Fredirick Maudlin 07/02/2020, 9:24 AM

## 2020-07-02 NOTE — Telephone Encounter (Signed)
Patient has been advised of Pre-Admission date/time, COVID Testing date and Surgery date.  Surgery Date: 07/15/20 Preadmission Testing Date: 07/08/20 (phone 8a-1p) Covid Testing Date: 07/12/20 - patient advised to go to the [facility] between 8a-1p  Patient has been made aware to call [phone], between 1-3:00pm the day before surgery, to find out what time to arrive for surgery.

## 2020-07-02 NOTE — H&P (View-Only) (Signed)
Patient ID: Kylie Curtis, female   DOB: [birth year], 40 y.o.   MRN: [id number]  Chief Complaint  Patient presents with  . Other    new pt ref Dr.Melissa Solum eval for thyroidectomy    HPI Kylie Curtis is a 40 y.o. female.   She has been referred by Dr. Gabriel Carina to discuss thyroidectomy.  Kylie Curtis states that she first noticed a lump in her neck in November and presented to the urgent care facility in [facility] for evaluation.  A thyroid ultrasound was done on November 16 that showed a dominant 3.3 cm solid thyroid nodule.  She was subsequently referred to Dr. Gabriel Carina, who performed a fine-needle aspiration biopsy on June 05, 2020.  The pathology report from [facility] was Bethesda category 4, suspicious for follicular neoplasm.  According to Dr. Joycie Peek notes, GEC was suspicious, but the [facility] expression atlas was negative; unfortunately, it appears that the copy of the report sent to me did not include those results but is only a preliminary, with the cytology on it.  Of note, the microscopic description reports "the cytologic and cell block preparations are markedly cellular, with numerous microfollicles, trabecular arrangements, and/or some macro follicles; follicular cells are enlarged and crowded with nuclear molding and chromatin changes.  There is relatively little colloid present."  As a result of these findings, [name] was referred for surgical management.  She reports that her mother had a goiter that required thyroidectomy in her 52s.  She endorses frequent throat clearing, pressure in her neck while in a supine position, some dysphagia (described as noticeable sensation when swallowing), occasional voice cracking, cold sensitivity, heart palpitations and feeling jittery and anxious.  She denies any changes in the texture of her hair, skin, or fingernails.  She has occasional diarrhea, but normally moves her bowels about every 2 to 3 days.  She does endorse fatigue and states that she  has gained about 50 pounds over 2-1/2 years.  No family history of thyroid cancer.  No personal history of occupational or therapeutic exposure to ionizing radiation.   Past Medical History:  Diagnosis Date  . Anxiety    Panic attacks  . Complication of anesthesia    took alot to get to sleep- with wisdom teeth  . Complication of anesthesia    woke up during hand surgery and could see them operating  . 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 Fibromyalgia   . GERD (gastroesophageal reflux disease)   . Headache    last one June 21,2016  . Hypoglycemia TMJ  . Pyelonephritis   . Shortness of breath dyspnea    with exertion    Past Surgical History:  Procedure Laterality Date  . INSERTION OF MESH N/A 12/18/2014   Procedure: INSERTION OF MESH;  Surgeon: Ralene Ok, MD;  Location: [place];  Service: General;  Laterality: N/A;  . LAPAROSCOPIC ABDOMINAL EXPLORATION    . right hand surgery Right    .Screws and graft- fracture  . TENDON REPAIR Right   . UMBILICAL HERNIA REPAIR N/A 12/18/2014   Procedure: LAPAROSCOPIC UMBILICAL HERNIA REPAIR WITH MESH;  Surgeon: Ralene Ok, MD;  Location: [place];  Service: General;  Laterality: N/A;  . WISDOM TOOTH EXTRACTION      Family History  Problem Relation Age of Onset  . Hyperlipidemia Mother   . Hypertension Mother   . Hyperlipidemia Father   . Hypertension Father   . Stroke Father     Social History Social  History   Tobacco Use  . Smoking status: Former Smoker    Packs/day: 0.25    Years: 17.00    Pack years: 4.25  . Smokeless tobacco: Never Used  Vaping Use  . Vaping Use: Never used  Substance Use Topics  . Alcohol use: No  . Drug use: No    Allergies  Allergen Reactions  . Codeine Itching  . Morphine And Related Itching  . Tramadol Itching  . Other Itching and Rash    Current Outpatient Medications  Medication Sig Dispense Refill  . Cholecalciferol (VITAMIN D3 PO) Take by mouth.    . Cyanocobalamin (B-12 SL)  Place under the tongue.    . ibuprofen (ADVIL) 600 MG tablet Take 1 tablet (600 mg total) by mouth every 6 (six) hours as needed. 30 tablet 0  . JOLIVETTE 0.35 MG tablet Take 1 tablet by mouth daily.  9  . Multiple Vitamin (MULTIVITAMIN WITH MINERALS) TABS tablet Take 1 tablet by mouth daily.    . Multiple Vitamins-Minerals (ZINC PO) Take by mouth.    . omeprazole (PRILOSEC) 20 MG capsule Take 20 mg by mouth daily.    . Turmeric 1053 MG TABS Take 1 tablet by mouth.    . albuterol (VENTOLIN HFA) 108 (90 Base) MCG/ACT inhaler Inhale 1-2 puffs into the lungs every 6 (six) hours as needed for wheezing or shortness of breath. 18 g 0  . benzonatate (TESSALON) 200 MG capsule Take 1 capsule (200 mg total) by mouth 3 (three) times daily as needed for cough. 30 capsule 0  . chlorpheniramine-HYDROcodone (TUSSIONEX PENNKINETIC ER) 10-8 MG/5ML SUER Take 5 mLs by mouth every 12 (twelve) hours as needed for cough. 60 mL 0  . fluticasone (FLONASE) 50 MCG/ACT nasal spray Place 2 sprays into both nostrils daily. 16 g 0  . Spacer/Aero-Holding Chambers (AEROCHAMBER PLUS) inhaler Use as instructed 1 each 2   No current facility-administered medications for this visit.    Review of Systems Review of Systems  Gastrointestinal: Positive for diarrhea and nausea.  Neurological: Positive for headaches.  All other systems reviewed and are negative. Or as discussed in the history of present illness.  Blood pressure 132/84, pulse (!) 102, temperature 98.3 F (36.8 C), temperature source Oral, height 5' 5" (1.651 m), weight 177 lb 9.6 oz (80.6 kg), SpO2 98 %.  Physical Exam Physical Exam Constitutional:      General: She is not in acute distress.    Appearance: She is obese.  HENT:     Head: Normocephalic and atraumatic.     Nose:     Comments: Covered with a mask Eyes:     General: No scleral icterus.       Right eye: No discharge.        Left eye: No discharge.     Conjunctiva/sclera: Conjunctivae normal.      Comments: No proptosis or exophthalmos.  No lid lag or stare.  Neck:     Thyroid: Thyroid mass present.     Trachea: No tracheal deviation.     Comments: There is a large palpable nodule within the right lobe of the thyroid.  It is smoothly contoured and rubbery in texture.  The gland moves freely with deglutition. Cardiovascular:     Rate and Rhythm: Regular rhythm. Tachycardia present.     Pulses: Normal pulses.     Heart sounds: No murmur heard.   Pulmonary:     Effort: Pulmonary effort is normal. No respiratory distress.       Breath sounds: Normal breath sounds.  Abdominal:     General: Bowel sounds are normal.     Palpations: Abdomen is soft.  Genitourinary:    Comments: Deferred Musculoskeletal:        General: No swelling, tenderness or deformity.     Cervical back: Normal range of motion and neck supple.  Lymphadenopathy:     Cervical: No cervical adenopathy.  Skin:    General: Skin is warm and dry.  Neurological:     General: No focal deficit present.     Mental Status: She is alert and oriented to person, place, and time.  Psychiatric:        Mood and Affect: Mood normal.        Behavior: Behavior normal.     Data Reviewed I personally reviewed the thyroid ultrasound images.  There are some additional small nodules within the thyroid parenchyma which do not meet criteria for biopsy.  The majority of these are on the right, but there is a left inferior pole nodule seen.  I concur with the radiologist's impression that is copied here:  CLINICAL DATA:  40 year old female with a history of neck mass  EXAM: THYROID ULTRASOUND  TECHNIQUE: Ultrasound examination of the thyroid gland and adjacent soft tissues was performed.  COMPARISON:  None.  FINDINGS: Parenchymal Echotexture: Mildly heterogenous  Isthmus: 0.3 cm  Right lobe: 5.6 cm x 2.4 cm x 3.0 cm  Left lobe: 4.2 cm x 0.9 cm x 1.3  cm  _________________________________________________________  Estimated total number of nodules >/= 1 cm: 1  Number of spongiform nodules >/=  2 cm not described below (TR1): 0  Number of mixed cystic and solid nodules >/= 1.5 cm not described below (TR2): 0  _________________________________________________________  Nodule # 1:  Location: Right; Mid  Maximum size: 3.3 cm; Other 2 dimensions: 2.3 cm x 1.9 cm  Composition: solid/almost completely solid (2)  Echogenicity: isoechoic (1)  Shape: not taller-than-wide (0)  Margins: smooth (0)  Echogenic foci: none (0)  ACR TI-RADS total points: 3.  ACR TI-RADS risk category: TR3 (3 points).  ACR TI-RADS recommendations:  Nodule meets criteria for biopsy  _________________________________________________________  Additional nodules are present, none of which meet criteria for surveillance or biopsy.  No adenopathy  IMPRESSION: Right mid thyroid nodule meets criteria for biopsy, as designated by the newly established ACR TI-RADS criteria, and referral for biopsy is recommended.  Recommendations follow those established by the new ACR TI-RADS criteria (J Am Coll Radiol 8295;62:130-865).   Electronically Signed   By: Corrie Mckusick D.O.   On: 05/07/2020 10:15  Thyroid function studies were performed at her urgent care visit on November 9.  These are copied here and demonstrate essentially normal thyroid function. Results for JULIEANA, ESHLEMAN (MRN [id number]) as of 07/02/2020 10:48  Ref. Range 04/30/2020 12:57  TSH Latest Ref Range: 0.450 - 4.500 uIU/mL 1.130  Thyroxine (T4) Latest Ref Range: 4.5 - 12.0 ug/dL 8.2  Free Thyroxine Index Latest Ref Range: 1.2 - 4.9  1.3  T3 Uptake Ratio Latest Ref Range: 24 - 39 % 16 (L)   I reviewed Dr. Joycie Peek clinic notes regarding this patient, including her recommendation for total thyroidectomy to facilitate potential radioactive iodine ablation, should  it be necessary.  I also reviewed the [facility] results provided to me by Dr. Joycie Peek office and summarized in the history of present illness.  Assessment This is a 40 year old woman with a right thyroid nodule suspicious by [facility] criteria.  This conveys roughly a 50% risk of malignancy.  I have recommended that she undergo surgical resection.  I discussed the pros and cons, as well as the risks and benefits of total thyroidectomy versus thyroid lobectomy.  After reviewing all of these options, despite Dr. Joycie Peek recommendation and my personal feeling that this is most likely a malignant lesion based upon the cytology result and appearance on ultrasound, Ms. Ackman stated that she would prefer to take her chances at needing a second surgery and would rather undergo a thyroid lobectomy upfront.  I did let her know that if my intraoperative findings included involved nodes or obvious extrathyroidal extension, I would proceed with total thyroidectomy at the time of surgery.  Plan The risks of thyroid surgery were discussed, including (but not limited to): bleeding, infection, damage to surrounding structures/tissues, injury (temporary or permanent) to the recurrent laryngeal nerve, hypoparathyroidism (temporary or permanent), need for thyroid hormone replacement therapy, need for additional surgery and/or treatment, recurrence of disease, tracheostomy (temporary or permanent).  The patient had the opportunity to ask any questions and these were answered to their satisfaction.  As this lesion is suspicious for cancer, I believe we can make her a priority 1 for operative intervention and we will work on getting her scheduled.    Fredirick Maudlin 07/02/2020, 9:24 AM

## 2020-07-08 ENCOUNTER — Other Ambulatory Visit: Payer: Self-pay

## 2020-07-08 ENCOUNTER — Encounter
Admission: RE | Admit: 2020-07-08 | Discharge: 2020-07-08 | Disposition: A | Discharge status: Home / Self Care | Payer: Self-pay | Source: Ambulatory Visit | Attending: General Surgery | Admitting: General Surgery

## 2020-07-08 HISTORY — DX: Nontoxic single thyroid nodule: E04.1

## 2020-07-08 HISTORY — DX: Family history of other specified conditions: Z84.89

## 2020-07-08 HISTORY — DX: Anemia, unspecified: D64.9

## 2020-07-08 HISTORY — DX: Personal history of urinary calculi: Z87.442

## 2020-07-08 HISTORY — DX: Unspecified convulsions: R56.9

## 2020-07-08 HISTORY — DX: Arthralgia of temporomandibular joint, unspecified side: M26.629

## 2020-07-08 NOTE — Patient Instructions (Addendum)
Your procedure is scheduled on:07-15-20 MONDAY Report to the Registration Desk on the 1st floor of the [facility]-Then proceed to the 2nd floor Surgery Desk in the [facility] To find out your arrival time, please call [phone] between 1PM - 3PM on:07-12-20 FRIDAY  REMEMBER: Instructions that are not followed completely may result in serious medical risk, up to and including death; or upon the discretion of your surgeon and anesthesiologist your surgery may need to be rescheduled.  Do not eat food after midnight the night before surgery.  No gum chewing, lozengers or hard candies.  You may however, drink CLEAR liquids up to 2 hours before you are scheduled to arrive for your surgery. Do not drink anything within 2 hours of your scheduled arrival time.  Clear liquids include: - water  - apple juice without pulp - gatorade - black coffee or tea (Do NOT add milk or creamers to the coffee or tea) Do NOT drink anything that is not on this list.  TAKE THESE MEDICATIONS THE MORNING OF SURGERY WITH A SIP OF WATER: -PRILOSEC (OMEPRAZOLE)-take one the night before and one on the morning of surgery - helps to prevent nausea after surgery.)  One week prior to surgery: Stop Anti-inflammatories (NSAIDS) such as Advil, Aleve, Ibuprofen, Motrin, Naproxen, Naprosyn and Aspirin based products such as Excedrin, Goodys Powder, BC Powder-OK TO TAKE TYLENOL IF NEEDED  Stop ANY OVER THE COUNTER supplements until after surgery-STOP YOUR TURMERIC NOW-YOU MAY RESUME AFTER SURGERY (However, you may continue taking Vitamin B12, Vitamin D3 and Zinc up until the day before surgery.)  No Alcohol for 24 hours before or after surgery.  No Smoking including e-cigarettes for 24 hours prior to surgery.  No chewable tobacco products for at least 6 hours prior to surgery.  No nicotine patches on the day of surgery.  Do not use any "recreational" drugs for at least a week prior to your surgery.  Please be advised  that the combination of cocaine and anesthesia may have negative outcomes, up to and including death. If you test positive for cocaine, your surgery will be cancelled.  On the morning of surgery brush your teeth with toothpaste and water, you may rinse your mouth with mouthwash if you wish. Do not swallow any toothpaste or mouthwash.  Do not wear jewelry, make-up, hairpins, clips or nail polish.  Do not wear lotions, powders, or perfumes.   Do not shave body from the neck down 48 hours prior to surgery just in case you cut yourself which could leave a site for infection.  Also, freshly shaved skin may become irritated if using the CHG soap.  Contact lenses, hearing aids and dentures may not be worn into surgery.  Do not bring valuables to the hospital. [facility] is not responsible for any missing/lost belongings or valuables.   Use CHG Soap as directed on instruction sheet.  Notify your doctor if there is any change in your medical condition (cold, fever, infection).  Wear comfortable clothing (specific to your surgery type) to the hospital.  Plan for stool softeners for home use; pain medications have a tendency to cause constipation. You can also help prevent constipation by eating foods high in fiber such as fruits and vegetables and drinking plenty of fluids as your diet allows.  After surgery, you can help prevent lung complications by doing breathing exercises.  Take deep breaths and cough every 1-2 hours. Your doctor may order a device called an Incentive Spirometer to help you  take deep breaths. When coughing or sneezing, hold a pillow firmly against your incision with both hands. This is called "splinting." Doing this helps protect your incision. It also decreases belly discomfort.  If you are being admitted to the hospital overnight, leave your suitcase in the car. After surgery it may be brought to your room.  If you are being discharged the day of surgery, you will not  be allowed to drive home. You will need a responsible adult (18 years or older) to drive you home and stay with you that night.   If you are taking public transportation, you will need to have a responsible adult (18 years or older) with you. Please confirm with your physician that it is acceptable to use public transportation.   Please call the [facility] at [phone] if you have any questions about these instructions.  Visitation Policy:  Patients undergoing a surgery or procedure may have one family member or support person with them as long as that person is not COVID-19 positive or experiencing its symptoms.  That person may remain in the waiting area during the procedure.  Inpatient Visitation:    Visiting hours are 7 a.m. to 8 p.m. Patients will be allowed one visitor. The visitor may change daily. The visitor must pass COVID-19 screenings, use hand sanitizer when entering and exiting the patient's room and wear a mask at all times, including in the patient's room. Patients must also wear a mask when staff or their visitor are in the room. Masking is required regardless of vaccination status. Systemwide, no visitors 17 or younger.

## 2020-07-12 ENCOUNTER — Other Ambulatory Visit
Admission: RE | Admit: 2020-07-12 | Discharge: 2020-07-12 | Disposition: A | Discharge status: Home / Self Care | Payer: HRSA Program | Source: Ambulatory Visit | Attending: General Surgery | Admitting: General Surgery

## 2020-07-12 ENCOUNTER — Other Ambulatory Visit: Payer: Self-pay

## 2020-07-12 DIAGNOSIS — Z20822 Contact with and (suspected) exposure to covid-19: Secondary | ICD-10-CM | POA: Insufficient documentation

## 2020-07-12 DIAGNOSIS — Z01812 Encounter for preprocedural laboratory examination: Secondary | ICD-10-CM | POA: Insufficient documentation

## 2020-07-13 LAB — SARS CORONAVIRUS 2 (TAT 6-24 HRS): SARS Coronavirus 2: NEGATIVE

## 2020-07-15 ENCOUNTER — Observation Stay
Admission: RE | Admit: 2020-07-15 | Discharge: 2020-07-16 | Disposition: A | Discharge status: Home / Self Care | Payer: Self-pay | Attending: General Surgery | Admitting: General Surgery

## 2020-07-15 ENCOUNTER — Ambulatory Visit: Payer: Self-pay | Admitting: Anesthesiology

## 2020-07-15 ENCOUNTER — Other Ambulatory Visit: Payer: Self-pay

## 2020-07-15 ENCOUNTER — Encounter
Admission: RE | Disposition: A | Discharge status: Home / Self Care | Payer: Self-pay | Source: Home / Self Care | Attending: General Surgery

## 2020-07-15 ENCOUNTER — Encounter: Payer: Self-pay | Admitting: General Surgery

## 2020-07-15 DIAGNOSIS — E890 Postprocedural hypothyroidism: Secondary | ICD-10-CM

## 2020-07-15 DIAGNOSIS — Z79899 Other long term (current) drug therapy: Secondary | ICD-10-CM | POA: Insufficient documentation

## 2020-07-15 DIAGNOSIS — Z87891 Personal history of nicotine dependence: Secondary | ICD-10-CM | POA: Insufficient documentation

## 2020-07-15 DIAGNOSIS — C73 Malignant neoplasm of thyroid gland: Principal | ICD-10-CM | POA: Insufficient documentation

## 2020-07-15 DIAGNOSIS — E041 Nontoxic single thyroid nodule: Secondary | ICD-10-CM

## 2020-07-15 HISTORY — PX: THYROID LOBECTOMY: SHX420

## 2020-07-15 LAB — GLUCOSE, CAPILLARY
Glucose-Capillary: 92 mg/dL (ref 70–99)
Glucose-Capillary: 112 mg/dL — ABNORMAL HIGH (ref 70–99)
Glucose-Capillary: 163 mg/dL — ABNORMAL HIGH (ref 70–99)

## 2020-07-15 LAB — POCT PREGNANCY, URINE: Preg Test, Ur: NEGATIVE

## 2020-07-15 SURGERY — LOBECTOMY, THYROID
Anesthesia: General | Site: Neck | Laterality: Right

## 2020-07-15 MED ORDER — ALUM & MAG HYDROXIDE-SIMETH 200-200-20 MG/5ML PO SUSP
ORAL | Status: AC
  Filled 2020-07-15: qty 30

## 2020-07-15 MED ORDER — ACETAMINOPHEN 500 MG PO TABS
ORAL_TABLET | ORAL | Status: AC
  Filled 2020-07-15: qty 2

## 2020-07-15 MED ORDER — LIDOCAINE HCL (CARDIAC) PF 100 MG/5ML IV SOSY
PREFILLED_SYRINGE | INTRAVENOUS | Status: DC | PRN
  Administered 2020-07-15: 80 mg via INTRAVENOUS

## 2020-07-15 MED ORDER — CHLORHEXIDINE GLUCONATE CLOTH 2 % EX PADS
6.0000 | MEDICATED_PAD | Freq: Once | CUTANEOUS | Status: DC
  Administered 2020-07-15: 6 via TOPICAL

## 2020-07-15 MED ORDER — SODIUM CHLORIDE (PF) 0.9 % IJ SOLN
INTRAMUSCULAR | Status: AC
  Filled 2020-07-15: qty 40

## 2020-07-15 MED ORDER — SUCCINYLCHOLINE CHLORIDE 20 MG/ML IJ SOLN
INTRAMUSCULAR | Status: DC | PRN
  Administered 2020-07-15: 100 mg via INTRAVENOUS

## 2020-07-15 MED ORDER — APREPITANT 40 MG PO CAPS
ORAL_CAPSULE | ORAL | Status: AC
  Administered 2020-07-15: 40 mg via ORAL
  Filled 2020-07-15: qty 1

## 2020-07-15 MED ORDER — CHOLECALCIFEROL 10 MCG (400 UNIT) PO TABS
1000.0000 [IU] | ORAL_TABLET | Freq: Every day | ORAL | Status: DC
  Administered 2020-07-15 – 2020-07-16 (×2): 1000 [IU] via ORAL
  Filled 2020-07-15 (×2): qty 3

## 2020-07-15 MED ORDER — DEXAMETHASONE SODIUM PHOSPHATE 10 MG/ML IJ SOLN
INTRAMUSCULAR | Status: DC | PRN
  Administered 2020-07-15: 10 mg via INTRAVENOUS

## 2020-07-15 MED ORDER — ACETAMINOPHEN 500 MG PO TABS
ORAL_TABLET | ORAL | Status: AC
  Administered 2020-07-15: 1000 mg via ORAL
  Filled 2020-07-15: qty 2

## 2020-07-15 MED ORDER — ACETAMINOPHEN 500 MG PO TABS
1000.0000 mg | ORAL_TABLET | Freq: Four times a day (QID) | ORAL | Status: DC
  Administered 2020-07-15 – 2020-07-16 (×4): 1000 mg via ORAL

## 2020-07-15 MED ORDER — PROPOFOL 10 MG/ML IV BOLUS
INTRAVENOUS | Status: AC
  Filled 2020-07-15: qty 20

## 2020-07-15 MED ORDER — PROMETHAZINE HCL 25 MG/ML IJ SOLN
6.2500 mg | Freq: Once | INTRAMUSCULAR | Status: AC
  Administered 2020-07-15: 6.25 mg via INTRAVENOUS

## 2020-07-15 MED ORDER — NORETHINDRONE 0.35 MG PO TABS: 1.0000 | ORAL_TABLET | Freq: Every day | ORAL | Status: DC

## 2020-07-15 MED ORDER — ZINC GLUCONATE 50 MG PO TABS: 50.0000 mg | ORAL_TABLET | Freq: Every day | ORAL | Status: DC

## 2020-07-15 MED ORDER — ONDANSETRON HCL 4 MG/2ML IJ SOLN
4.0000 mg | Freq: Once | INTRAMUSCULAR | Status: AC | PRN
  Administered 2020-07-15: 4 mg via INTRAVENOUS

## 2020-07-15 MED ORDER — MIDAZOLAM HCL 2 MG/2ML IJ SOLN
INTRAMUSCULAR | Status: AC
  Filled 2020-07-15: qty 2

## 2020-07-15 MED ORDER — DEXTROSE IN LACTATED RINGERS 5 % IV SOLN: INTRAVENOUS | Status: DC

## 2020-07-15 MED ORDER — SODIUM CHLORIDE FLUSH 0.9 % IV SOLN
INTRAVENOUS | Status: AC
  Filled 2020-07-15: qty 10

## 2020-07-15 MED ORDER — ALUM & MAG HYDROXIDE-SIMETH 200-200-20 MG/5ML PO SUSP
30.0000 mL | ORAL | Status: DC | PRN
  Administered 2020-07-15: 30 mL via ORAL

## 2020-07-15 MED ORDER — KETOROLAC TROMETHAMINE 30 MG/ML IJ SOLN
INTRAMUSCULAR | Status: DC | PRN
  Administered 2020-07-15: 30 mg via INTRAVENOUS

## 2020-07-15 MED ORDER — MENTHOL 3 MG MT LOZG
1.0000 | LOZENGE | OROMUCOSAL | Status: DC | PRN
  Filled 2020-07-15: qty 9

## 2020-07-15 MED ORDER — ONDANSETRON 4 MG PO TBDP: 4.0000 mg | ORAL_TABLET | Freq: Four times a day (QID) | ORAL | Status: DC | PRN

## 2020-07-15 MED ORDER — LACTATED RINGERS IV SOLN: INTRAVENOUS | Status: DC

## 2020-07-15 MED ORDER — DIPHENHYDRAMINE HCL 50 MG/ML IJ SOLN
INTRAMUSCULAR | Status: AC
  Filled 2020-07-15: qty 1

## 2020-07-15 MED ORDER — METHOCARBAMOL 500 MG PO TABS: 500.0000 mg | ORAL_TABLET | Freq: Four times a day (QID) | ORAL | Status: DC | PRN

## 2020-07-15 MED ORDER — ZINC SULFATE 220 (50 ZN) MG PO CAPS
220.0000 mg | ORAL_CAPSULE | Freq: Every day | ORAL | Status: DC
  Administered 2020-07-15 – 2020-07-16 (×2): 220 mg via ORAL
  Filled 2020-07-15 (×2): qty 1

## 2020-07-15 MED ORDER — ACETAMINOPHEN 500 MG PO TABS: 1000.0000 mg | ORAL_TABLET | ORAL | Status: AC

## 2020-07-15 MED ORDER — PROPOFOL 10 MG/ML IV BOLUS
INTRAVENOUS | Status: DC | PRN
  Administered 2020-07-15: 180 mg via INTRAVENOUS

## 2020-07-15 MED ORDER — FENTANYL CITRATE (PF) 100 MCG/2ML IJ SOLN
INTRAMUSCULAR | Status: AC
  Administered 2020-07-15: 25 ug via INTRAVENOUS
  Filled 2020-07-15: qty 2

## 2020-07-15 MED ORDER — ONDANSETRON HCL 4 MG/2ML IJ SOLN: 4.0000 mg | Freq: Four times a day (QID) | INTRAMUSCULAR | Status: DC | PRN

## 2020-07-15 MED ORDER — OXYCODONE HCL 5 MG PO TABS
ORAL_TABLET | ORAL | Status: AC
  Filled 2020-07-15: qty 1

## 2020-07-15 MED ORDER — PROMETHAZINE HCL 25 MG/ML IJ SOLN
INTRAMUSCULAR | Status: AC
  Filled 2020-07-15: qty 1

## 2020-07-15 MED ORDER — VITAMIN B-12 1000 MCG PO TABS
1000.0000 ug | ORAL_TABLET | Freq: Every day | ORAL | Status: DC
  Administered 2020-07-15 – 2020-07-16 (×2): 1000 ug via ORAL
  Filled 2020-07-15 (×2): qty 1

## 2020-07-15 MED ORDER — CHLORHEXIDINE GLUCONATE 0.12 % MT SOLN
OROMUCOSAL | Status: AC
  Administered 2020-07-15: 15 mL via OROMUCOSAL
  Filled 2020-07-15: qty 15

## 2020-07-15 MED ORDER — FENTANYL CITRATE (PF) 100 MCG/2ML IJ SOLN
INTRAMUSCULAR | Status: DC | PRN
  Administered 2020-07-15: 100 ug via INTRAVENOUS
  Administered 2020-07-15: 50 ug via INTRAVENOUS

## 2020-07-15 MED ORDER — PHENYLEPHRINE HCL (PRESSORS) 10 MG/ML IV SOLN
INTRAVENOUS | Status: DC | PRN
  Administered 2020-07-15: 80 ug via INTRAVENOUS

## 2020-07-15 MED ORDER — ORAL CARE MOUTH RINSE: 15.0000 mL | Freq: Once | OROMUCOSAL | Status: AC

## 2020-07-15 MED ORDER — OXYCODONE HCL 5 MG PO TABS
5.0000 mg | ORAL_TABLET | Freq: Once | ORAL | Status: AC | PRN
  Administered 2020-07-15: 5 mg via ORAL

## 2020-07-15 MED ORDER — FENTANYL CITRATE (PF) 100 MCG/2ML IJ SOLN
INTRAMUSCULAR | Status: AC
  Filled 2020-07-15: qty 2

## 2020-07-15 MED ORDER — IBUPROFEN 600 MG PO TABS
600.0000 mg | ORAL_TABLET | Freq: Four times a day (QID) | ORAL | Status: DC | PRN
  Administered 2020-07-15: 600 mg via ORAL
  Filled 2020-07-15: qty 1

## 2020-07-15 MED ORDER — LACTATED RINGERS IV SOLN: INTRAVENOUS | Status: DC | PRN

## 2020-07-15 MED ORDER — ONDANSETRON HCL 4 MG/2ML IJ SOLN
INTRAMUSCULAR | Status: AC
  Filled 2020-07-15: qty 2

## 2020-07-15 MED ORDER — APREPITANT 40 MG PO CAPS: 40.0000 mg | ORAL_CAPSULE | Freq: Once | ORAL | Status: AC

## 2020-07-15 MED ORDER — OXYCODONE HCL 5 MG/5ML PO SOLN: 5.0000 mg | Freq: Once | ORAL | Status: AC | PRN

## 2020-07-15 MED ORDER — PANTOPRAZOLE SODIUM 40 MG PO TBEC
40.0000 mg | DELAYED_RELEASE_TABLET | Freq: Every day | ORAL | Status: DC
  Administered 2020-07-16: 40 mg via ORAL
  Filled 2020-07-15: qty 1

## 2020-07-15 MED ORDER — METHOCARBAMOL 500 MG PO TABS
ORAL_TABLET | ORAL | Status: AC
  Administered 2020-07-15: 500 mg via ORAL
  Filled 2020-07-15: qty 1

## 2020-07-15 MED ORDER — PROPOFOL 500 MG/50ML IV EMUL
INTRAVENOUS | Status: DC | PRN
  Administered 2020-07-15: 25 ug/kg/min via INTRAVENOUS

## 2020-07-15 MED ORDER — CHLORHEXIDINE GLUCONATE 0.12 % MT SOLN: 15.0000 mL | Freq: Once | OROMUCOSAL | Status: AC

## 2020-07-15 MED ORDER — FENTANYL CITRATE (PF) 100 MCG/2ML IJ SOLN
25.0000 ug | INTRAMUSCULAR | Status: DC | PRN
  Administered 2020-07-15: 50 ug via INTRAVENOUS
  Administered 2020-07-15: 25 ug via INTRAVENOUS

## 2020-07-15 MED ORDER — SIMETHICONE 80 MG PO CHEW
40.0000 mg | CHEWABLE_TABLET | Freq: Four times a day (QID) | ORAL | Status: DC | PRN
  Filled 2020-07-15: qty 1

## 2020-07-15 SURGICAL SUPPLY — 44 items
BACTOSHIELD CHG 4% 4OZ (MISCELLANEOUS) ×1
BASIN GRAD PLASTIC 32OZ STRL (MISCELLANEOUS) ×2 IMPLANT
BLADE SURG 15 STRL LF DISP TIS (BLADE) ×1 IMPLANT
BLADE SURG 15 STRL SS (BLADE) ×2
CANISTER SUCT 1200ML W/VALVE (MISCELLANEOUS) ×2 IMPLANT
CLIP VESOCCLUDE SM WIDE 6/CT (CLIP) ×2 IMPLANT
COVER WAND RF STERILE (DRAPES) ×2 IMPLANT
DERMABOND ADVANCED (GAUZE/BANDAGES/DRESSINGS) ×1
DERMABOND ADVANCED .7 DNX12 (GAUZE/BANDAGES/DRESSINGS) ×1 IMPLANT
DRAPE MAG INST 16X20 L/F (DRAPES) ×2 IMPLANT
DRAPE THYROID T SHEET (DRAPES) ×2 IMPLANT
DRSG TEGADERM 2-3/8X2-3/4 SM (GAUZE/BANDAGES/DRESSINGS) ×2 IMPLANT
ELECT CAUTERY BLADE TIP 2.5 (TIP) ×2
ELECT LARYNGEAL DUAL CHAN (ELECTRODE) ×2 IMPLANT
ELECT NEEDLE 20X.3 GREEN (MISCELLANEOUS) ×2
ELECT REM PT RETURN 9FT ADLT (ELECTROSURGICAL) ×2
ELECTRODE CAUTERY BLDE TIP 2.5 (TIP) ×1 IMPLANT
ELECTRODE NEEDLE 20X.3 GREEN (MISCELLANEOUS) ×1 IMPLANT
ELECTRODE REM PT RTRN 9FT ADLT (ELECTROSURGICAL) ×1 IMPLANT
GAUZE 4X4 16PLY RFD (DISPOSABLE) ×2 IMPLANT
GLOVE INDICATOR 7.0 STRL GRN (GLOVE) ×2 IMPLANT
GLOVE SURG ENC MOIS LTX SZ6.5 (GLOVE) ×4 IMPLANT
GOWN STRL REUS W/ TWL LRG LVL3 (GOWN DISPOSABLE) ×3 IMPLANT
GOWN STRL REUS W/TWL LRG LVL3 (GOWN DISPOSABLE) ×6
HEMOSTAT SNOW SURGICEL 2X4 (HEMOSTASIS) ×2 IMPLANT
KIT TURNOVER KIT A (KITS) ×2 IMPLANT
LABEL OR SOLS (LABEL) ×2 IMPLANT
MANIFOLD NEPTUNE II (INSTRUMENTS) ×2 IMPLANT
NS IRRIG 500ML POUR BTL (IV SOLUTION) ×2 IMPLANT
PACK BASIN MINOR ARMC (MISCELLANEOUS) ×2 IMPLANT
PROBE NEUROSIGN BIPOL (MISCELLANEOUS) ×1 IMPLANT
PROBE NEUROSIGN BIPOLAR (MISCELLANEOUS) ×2
SCRUB CHG 4% DYNA-HEX 4OZ (MISCELLANEOUS) ×1 IMPLANT
SET WALTER ACTIVATION W/DRAPE (SET/KITS/TRAYS/PACK) ×2 IMPLANT
SHEARS HARMONIC 9CM CVD (BLADE) ×2 IMPLANT
SPONGE KITTNER 5P (MISCELLANEOUS) ×2 IMPLANT
STRIP CLOSURE SKIN 1/2X4 (GAUZE/BANDAGES/DRESSINGS) ×2 IMPLANT
SUT MNCRL AB 4-0 PS2 18 (SUTURE) IMPLANT
SUT PROLENE 4 0 PS 2 18 (SUTURE) ×2 IMPLANT
SUT SILK 2 0 (SUTURE) ×2
SUT SILK 2-0 18XBRD TIE 12 (SUTURE) ×1 IMPLANT
SUT VIC AB 4-0 RB1 27 (SUTURE) ×2
SUT VIC AB 4-0 RB1 27X BRD (SUTURE) ×1 IMPLANT
SYR BULB IRRIG 60ML STRL (SYRINGE) ×2 IMPLANT

## 2020-07-15 NOTE — Interval H&P Note (Signed)
History and Physical Interval Note:  07/15/2020 7:14 AM  Kylie Curtis  has presented today for surgery, with the diagnosis of thyroid nodule suspicious for cancer.  The various methods of treatment have been discussed with the patient and family. After consideration of risks, benefits and other options for treatment, the patient has consented to  Procedure(s): THYROID LOBECTOMY, possible total thyroidectomy (Right) as a surgical intervention.  The patient's history has been reviewed, patient examined, no change in status, stable for surgery.  I have reviewed the patient's chart and labs.  Questions were answered to the patient's satisfaction.     Fredirick Maudlin

## 2020-07-15 NOTE — Op Note (Signed)
Operative Note  Preoperative Diagnosis:  a suspicious thyroid biopsy  Postoperative Diagnosis:  a suspicious thyroid biopsy  Operation:  Right Thyroid Lobectomy and Isthmusectomy  Surgeon: Fredirick Maudlin, MD  Assistant: Ardath Sax, MD (a second surgeon was necessary due to the technical complexity of the case)  Anesthesia: General endotracheal with nerve monitoring system.  Findings: Dominant right thyroid nodule without any evidence of extrathyroidal extension/muscle involvement or any suspicious lymph nodes.  Both parathyroid glands were preserved in situ.  The recurrent laryngeal nerve was visualized throughout its entire course in the neck and gave a good functional signal throughout the operation.  Indications: This is a 40 year old woman who self identified a lump in her right neck.  Further evaluation included ultrasound that identified a thyroid nodule meeting TI-RADS criteria for biopsy.  The biopsy was performed and based on Afirma, was suspicious.  She was was referred for surgical evaluation.  After discussing the pros and cons, risks and benefits of total thyroidectomy versus thyroid lobectomy, the patient selected the latter procedure.  The risk of the operation were discussed with her in detail and she agreed to proceed.  Procedure In Detail: The patient was identified in the preoperative holding area and brought to the operating room where she was placed supine on the OR table.  Bony prominences were padded and bilateral sequential compression devices were placed on the lower extremities.  General endotracheal anesthesia was induced using the nerve monitoring system.  Tube placement was verified via the McGrath laryngoscope.  The grounding lead was placed.  The patient was then positioned appropriately for the operation and sterilely prepped and draped in standard fashion.  A timeout was performed confirming her identity, the procedure being performed, her allergies, all  necessary equipment was available, and that maintenance anesthesia was adequate.  A 5 cm transverse incision was made in a natural skin fold that was positioned appropriately for the operation.  This was carried down through the subcutaneous tissues and platysma using electrocautery.  Subplatysmal flaps were elevated and the strap muscles were divided in the median raphae.  The strap muscles were elevated off of the right lobe of the thyroid.  The superior pole vessels were isolated and divided with silk ties and the harmonic scalpel.  The thyroid was rotated medially.  The superior parathyroid gland was dissected off of the capsule of the thyroid and preserved on a good vascular pedicle.  We dissected into the lateral fibrofatty tissues of the central neck and identified the recurrent laryngeal nerve.  It gave an excellent signal when stimulated.  The inferior parathyroid gland was dissected off of the thyroid tissue and preserved on a good pedicle.  The trachea was exposed medial to the nerve and the inferior pole vessels were divided.  The nerve was dissected up to its insertion point at the cricopharyngeal muscle.  The ligament of Berry and anterior suspensory ligament were divided.  The gland was dissected off of the trachea and across the midline.  The thyroid tissue was divided at the left lateral aspect of the isthmus using the harmonic scalpel.  It was excised and handed off as a specimen.  The cut edge of the thyroid tissue was cauterized to minimize any oozing.  We irrigated the wound bed and obtain good hemostasis.  A Valsalva maneuver from the anesthesia team confirmed no ongoing surgical bleeding. SNoW was applied for additional hemostasis.  The strap muscles were closed in the midline with running 4-0 Vicryl.  The platysma was  closed with interrupted Vicryl.  The skin was closed with running subcuticular Prolene.  The skin was cleaned.  Dermabond and Steri-Strips were applied.  The Prolene suture  was removed.  The patient was awakened, extubated, and taken to the postanesthesia care unit in good condition.  EBL: Less than 5 cc  IVF: See anesthesia record  Specimen(s): Right thyroid lobe and isthmus to pathology for permanent section.  Complications: none immediately apparent.   Counts: all needles, instruments, and sponges were counted and reported to be correct in number at the end of the case.   I was present for and participated in the entire operation.  Fredirick Maudlin 9:44 AM

## 2020-07-15 NOTE — Discharge Instructions (Signed)

## 2020-07-15 NOTE — Anesthesia Preprocedure Evaluation (Signed)
Anesthesia Evaluation  Patient identified by MRN, date of birth, ID band Patient awake    Reviewed: Allergy & Precautions, NPO status , Patient's Chart, lab work & pertinent test results  History of Anesthesia Complications Negative for: history of anesthetic complications  Airway Mallampati: I  TM Distance: >3 FB Neck ROM: Full    Dental no notable dental hx. (+) Teeth Intact   Pulmonary neg pulmonary ROS, neg sleep apnea, neg COPD, Patient abstained from smoking.Not current smoker, former smoker,    Pulmonary exam normal breath sounds clear to auscultation       Cardiovascular Exercise Tolerance: Good METS(-) hypertension(-) CAD and (-) Past MI negative cardio ROS  (-) dysrhythmias  Rhythm:Regular Rate:Normal - Systolic murmurs    Neuro/Psych  Headaches, neg Seizures PSYCHIATRIC DISORDERS Anxiety  Neuromuscular disease    GI/Hepatic GERD  Controlled and Medicated,(+)     (-) substance abuse  ,   Endo/Other  neg diabetes  Renal/GU negative Renal ROS     Musculoskeletal  (+) Fibromyalgia -  Abdominal   Peds  Hematology   Anesthesia Other Findings Past Medical History: No date: Anemia     Comment:  h/o No date: Anxiety     Comment:  Panic attacks No date: Complication of anesthesia     Comment:  took alot to get to sleep- with wisdom teeth No date: Complication of anesthesia     Comment:  woke up during hand surgery and could see them operating No date: Family history of adverse reaction to anesthesia     Comment:  mom hard to put to sleep No date: Fibromyalgia No date: GERD (gastroesophageal reflux disease) No date: Headache     Comment:  h/o migraines No date: History of kidney stones     Comment:  h/o TMJ: Hypoglycemia No date: Hypoglycemia No date: Pyelonephritis No date: Seizure ([place])     Comment:  x 1 as a teenager none since No date: Thyroid nodule 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  Reproductive/Obstetrics                             Anesthesia Physical Anesthesia Plan  ASA: II  Anesthesia Plan: General   Post-op Pain Management:    Induction: Intravenous  PONV Risk Score and Plan: 4 or greater and Ondansetron, Dexamethasone, Diphenhydramine and Midazolam  Airway Management Planned: Oral ETT and Video Laryngoscope Planned  Additional Equipment: None  Intra-op Plan:   Post-operative Plan: Extubation in OR  Informed Consent: I have reviewed the patients History and Physical, chart, labs and discussed the procedure including the risks, benefits and alternatives for the proposed anesthesia with the patient or authorized representative who has indicated his/her understanding and acceptance.     Dental advisory given  Plan Discussed with: CRNA and Surgeon  Anesthesia Plan Comments: (Discussed risks of anesthesia with patient, including PONV, sore throat, lip/dental damage. Rare risks discussed as well, such as cardiorespiratory and neurological sequelae. Patient understands.)        Anesthesia Quick Evaluation

## 2020-07-15 NOTE — Transfer of Care (Signed)
Immediate Anesthesia Transfer of Care Note  Patient: Kylie Curtis  Procedure(s) Performed: RIGHT THYROID LOBECTOMY (Right Neck)  Patient Location: PACU  Anesthesia Type:General  Level of Consciousness: sedated  Airway & Oxygen Therapy: Patient Spontanous Breathing and Patient connected to face mask oxygen  Post-op Assessment: Report given to RN and Post -op Vital signs reviewed and stable  Post vital signs: Reviewed and stable  Last Vitals:  Vitals Value Taken Time  BP 116/71 07/15/20 0935  Temp 35.9 C 07/15/20 0935  Pulse 85 07/15/20 0938  Resp 23 07/15/20 0938  SpO2 99 % 07/15/20 0938  Vitals shown include unvalidated device data.  Last Pain:  Vitals:   07/15/20 0621  TempSrc: Temporal      Patients Stated Pain Goal: 0 (80/16/55 3748)  Complications: No complications documented.

## 2020-07-15 NOTE — Anesthesia Postprocedure Evaluation (Signed)
Anesthesia Post Note  Patient: Kylie Curtis  Procedure(s) Performed: RIGHT THYROID LOBECTOMY (Right Neck)  Patient location during evaluation: PACU Anesthesia Type: General Level of consciousness: awake and alert Pain management: pain level controlled Vital Signs Assessment: post-procedure vital signs reviewed and stable Respiratory status: spontaneous breathing, nonlabored ventilation, respiratory function stable and patient connected to nasal cannula oxygen Cardiovascular status: blood pressure returned to baseline and stable Postop Assessment: no apparent nausea or vomiting Anesthetic complications: no   No complications documented.   Last Vitals:  Vitals:   07/15/20 1119 07/15/20 1200  BP:  120/70  Pulse: 69 82  Resp: 20 16  Temp:  36.6 C  SpO2: 100% 99%    Last Pain:  Vitals:   07/15/20 1200  TempSrc: Temporal  PainSc: 4                  Arita Miss

## 2020-07-16 ENCOUNTER — Encounter: Payer: Self-pay | Admitting: General Surgery

## 2020-07-16 MED ORDER — METHOCARBAMOL 500 MG PO TABS: 500.0000 mg | ORAL_TABLET | Freq: Four times a day (QID) | ORAL | 0 refills | Status: DC | PRN

## 2020-07-16 MED ORDER — METHOCARBAMOL 500 MG PO TABS
ORAL_TABLET | ORAL | Status: AC
  Administered 2020-07-16: 500 mg via ORAL
  Filled 2020-07-16: qty 1

## 2020-07-16 MED ORDER — IBUPROFEN 600 MG PO TABS
ORAL_TABLET | ORAL | Status: AC
  Administered 2020-07-16: 600 mg via ORAL
  Filled 2020-07-16: qty 1

## 2020-07-16 MED ORDER — ACETAMINOPHEN 500 MG PO TABS
ORAL_TABLET | ORAL | Status: AC
  Filled 2020-07-16: qty 2

## 2020-07-16 NOTE — Discharge Summary (Addendum)
[facility] SURGICAL DISCHARGE SUMMARY  Patient ID: Kylie Curtis MRN: [id number] DOB/AGE: 1980-06-24 40 y.o.  Admit date: 07/15/2020 Discharge date: 07/16/2020  Discharge Diagnoses Patient Active Problem List   Diagnosis Date Noted   S/P partial thyroidectomy 07/15/2020   Thyroid nodule     Consultants None  Procedures 07/15/2020:  Right Thyroid Lobectomy and Isthmusectomy   HPI: Kylie Curtis  is a 40 year old woman who self identified a lump in her right neck.  Further evaluation included ultrasound that identified a thyroid nodule meeting TI-RADS criteria for biopsy.  The biopsy was performed and based on Afirma, was suspicious.  She was was referred for surgical evaluation.  After discussing the pros and cons, risks and benefits of total thyroidectomy versus thyroid lobectomy, the patient selected the latter procedure.  The risk of the operation were discussed with her in detail and she agreed to proceed.  Hospital Course: Informed consent was obtained and documented, and patient underwent uneventful Right Thyroid Lobectomy and Isthmusectomy   (Dr Celine Ahr, 07/15/2020).  Post-operatively, patient did well and advancement of patient's diet and ambulation were well-tolerated. The remainder of patient's hospital course was essentially unremarkable, and discharge planning was initiated accordingly with patient safely able to be discharged home with appropriate discharge instructions, pain control, and outpatient follow-up after all of her questions were answered to her expressed satisfaction.   Discharge Condition: Good   Physical Examination:  Constitutional: Well appearing female, NAD Pulmonary: Normal effort, no respiratory distress, good phonation Skin: 5 cm transverse incision to the anterior neck is CDI with steri-strips, no evidence of erythema or drainage, she does appear somewhat flushed this morning    Allergies as of 07/16/2020       Reactions    Codeine Itching   Morphine And Related Itching   Tramadol Itching   Other Itching, Rash        Medication List     TAKE these medications    cholecalciferol 25 MCG (1000 UNIT) tablet Commonly known as: VITAMIN D3 Take 1,000 Units by mouth daily.   Goodys Extra Strength [id number] MG Pack Generic drug: Aspirin-Acetaminophen-Caffeine Take 1 packet by mouth daily as needed (pain).   ibuprofen 600 MG tablet Commonly known as: ADVIL Take 1 tablet (600 mg total) by mouth every 6 (six) hours as needed.   Jolivette 0.35 MG tablet Generic drug: norethindrone Take 1 tablet by mouth daily.   methocarbamol 500 MG tablet Commonly known as: ROBAXIN Take 1 tablet (500 mg total) by mouth every 6 (six) hours as needed for muscle spasms.   omeprazole 20 MG capsule Commonly known as: PRILOSEC Take 20 mg by mouth daily as needed (acid reflux).   TURMERIC PO Take 1 tablet by mouth daily.   vitamin B-12 1000 MCG tablet Commonly known as: CYANOCOBALAMIN Take 1,000 mcg by mouth daily.   zinc gluconate 50 MG tablet Take 50 mg by mouth daily.          Follow-up Information     Fredirick Maudlin, MD. Schedule an appointment as soon as possible for a visit in 2 week(s).   Specialty: General Surgery Why: s/p thyroid lobectomy  Contact information: [address] [place] [address] [phone]                  Time spent on discharge management including discussion of hospital course, clinical condition, outpatient instructions, prescriptions, and follow up with the patient and members of the medical team: >30 minutes  --  Edison Simon , PA-C [facility]  07/16/2020, 7:52 AM [phone] M-F: 7am - 4pm  I saw and evaluated the patient.  I agree with the above documentation, exam, and plan, which I have edited where appropriate. Fredirick Maudlin  8:44 AM

## 2020-07-24 LAB — SURGICAL PATHOLOGY

## 2020-07-25 ENCOUNTER — Ambulatory Visit (INDEPENDENT_AMBULATORY_CARE_PROVIDER_SITE_OTHER): Payer: Self-pay | Admitting: General Surgery

## 2020-07-25 ENCOUNTER — Encounter: Payer: Self-pay | Admitting: General Surgery

## 2020-07-25 ENCOUNTER — Other Ambulatory Visit: Payer: Self-pay

## 2020-07-25 ENCOUNTER — Telehealth: Payer: Self-pay | Admitting: General Surgery

## 2020-07-25 ENCOUNTER — Telehealth: Payer: Self-pay

## 2020-07-25 VITALS — BP 119/66 | HR 78 | Temp 98.4°F | Ht 65.0 in | Wt 176.6 lb

## 2020-07-25 DIAGNOSIS — E890 Postprocedural hypothyroidism: Secondary | ICD-10-CM

## 2020-07-25 NOTE — Patient Instructions (Signed)
If you have any concerns or questions, please feel free to call our office.    

## 2020-07-25 NOTE — Telephone Encounter (Signed)
Patient states she is having drainage from the incision-she had right thyroid lobectomy 07/15/20. She had noticed clear however she is having some bright red -the top steri strip is red with blood, but not bleeding now. This just all stated a few minutes ago. Denies fever, no difficulty swallowing, not hot to the touch, looks a little swollen.

## 2020-07-25 NOTE — Telephone Encounter (Signed)
Discussed pathology from thyroid lobectomy with patient.  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  Thyroid lobectomy is sufficient treatment.  She seems to be recovering well from surgery and I will see her next week.

## 2020-07-25 NOTE — Progress Notes (Signed)
Kylie Curtis is here today for a wound check.  She had a right thyroid lobectomy on July 15, 2020.  I spoke with her earlier today about her pathology.  She contacted our office a couple of hours later and noticed some drainage on her Steri-Strips.  She is here for evaluation.  She says that occasionally her voice breaks and sometimes is hard to swallow, secondary to a foreign body sensation.  She denies any fevers or chills.  She has had occasional bouts of nausea and some dizziness but has not vomited.  She says that she is moving her neck through a full range of motion.   Today's Vitals   07/25/20 1357  BP: 119/66  Pulse: 78  Temp: 98.4 F (36.9 C)  TempSrc: Oral  SpO2: 98%  Weight: 176 lb 9.6 oz (80.1 kg)  Height: 5\' 5"  (1.651 m)   Body mass index is 29.39 kg/m. Focused examination demonstrates that the Steri-Strips are still in situ.  There is some dried serosanguineous fluid on the superior aspect of her Steri-Strips, just to the left of midline.  There is a normal amount of postoperative swelling present.  I did not remove the Steri-Strips as it has only been a little over a week since her surgery.  Impression and plan: This is a 40 year old woman status post right thyroid lobectomy.  Final pathology was consistent with an encapsulated follicular variant papillary thyroid carcinoma.  No additional surgical intervention is warranted.  She was concerned about her wound today, but upon inspection, it appears that there is only a possible slight skin separation.  I reinforced her Steri-Strips and I will see her next week as scheduled.

## 2020-07-30 ENCOUNTER — Encounter: Payer: Self-pay | Admitting: General Surgery

## 2020-07-30 ENCOUNTER — Other Ambulatory Visit: Payer: Self-pay

## 2020-07-30 ENCOUNTER — Ambulatory Visit (INDEPENDENT_AMBULATORY_CARE_PROVIDER_SITE_OTHER): Payer: Self-pay | Admitting: General Surgery

## 2020-07-30 VITALS — BP 131/83 | HR 91 | Temp 98.8°F | Ht 65.0 in | Wt 175.4 lb

## 2020-07-30 DIAGNOSIS — E890 Postprocedural hypothyroidism: Secondary | ICD-10-CM

## 2020-07-30 MED ORDER — PANTOPRAZOLE SODIUM 40 MG PO TBEC: 40.0000 mg | DELAYED_RELEASE_TABLET | Freq: Every day | ORAL | 0 refills | Status: AC

## 2020-07-30 NOTE — Progress Notes (Signed)
Kylie Curtis is here today for a postoperative visit.  She is a 40 year old woman who underwent a right thyroid lobectomy for an Afirma suspicious thyroid nodule on July 15, 2020.  Final pathology is copied here:  Specimen Submitted:  A. Thyroid, right lobe, and isthmus   Clinical History: Thyroid nodule suspicious for cancer       DIAGNOSIS:  A. THYROID, RIGHT LOBE AND ISTHMUS; LOBECTOMY:  - PAPILLARY THYROID CARCINOMA, SEE CANCER SUMMARY BELOW.  - ONE LYMPH NODE, NEGATIVE FOR MALIGNANCY (0/1).  - ONE PARATHYROID GLAND WITH NO SIGNIFICANT PATHOLOGIC ALTERATION.   CANCER CASE SUMMARY: THYROID GLAND  Standard(s): AJCC-UICC 8   SPECIMEN  Procedure: Lobectomy   TUMOR  Tumor Focality: Multifocal  Tumor Site: Tumor #1 mid pole, Tumor #2 inferior pole  Tumor Size: Tumor #1 - 2.7 x 2.2 x 2 cm, Tumor #2 - 0.4 x 0.3 x 0.3 cm  Histologic Type: Papillary carcinoma, follicular variant,  encapsulated/well demarcated,  noninvasive  Angioinvasion (Vascular Invasion): Not identified  Lymphatic Invasion: Not identified  Extrathyroidal Extension: Not identified  Margin Status: All margins negative for carcinoma   REGIONAL LYMPH NODES  Regional Lymph Nodes:  Regional lymph nodes present (1), negative for tumor   DISTANT METASTASIS  Distant Site(s) Involved, if applicable (select all that apply): Not  applicable   PATHOLOGIC STAGE CLASSIFICATION (pTNM, AJCC 8th Edition):  TNM Descriptors: M (multifocal)  pT2  pN0  pM - Not applicable   ADDENDUM:  This addendum is issued for clarification as to why the tumor is not  classified as NIFTP (noninvasive follicular thyroid neoplasm with  papillary like nuclear features). Per the CAP Thyroid Cancer Checklist  (AJCC-UICC 8), a tumor may not be classified as NIFTP if it demonstrates  any of the following exclusionary criteria: infiltration/tumoral  capsular invasion, solid/trabecular or insular growth greater than 30%,  or true  papillary growth. This tumor demonstrates >30% solid/trabecular  growth pattern and is therefore excluded from the NIFTP category. The  diagnosis above (papillary thyroid carcinoma, follicular variant,  encapsulated/well demarcated, noninvasive) remains unchanged. The case  was shown for intradepartmental consensus.   Last week, she noticed some drainage from her wound and came to clinic.  It appeared that there was a small skin separation and the wound was redressed with Steri-Strips.  She is here today for her formal postoperative visit.  She states that she still notices occasional raspiness in her voice.  She says this is more prevalent when she first wakes up in the morning and when she is out in cold weather.  She says that she feels her throat physically tighten.  Once she is back inside where it is warm, her voice seems to relax and resume its normal pitch and tone.  She says she still sometimes feels like things get stuck in the back of her throat when she swallows or that there is a foreign body present.  She has had a little bit more drainage from the wound, but she says this has more or less stopped in the last day or so.  Today's Vitals   07/30/20 0854  BP: 131/83  Pulse: 91  Temp: 98.8 F (37.1 C)  TempSrc: Oral  SpO2: 98%  Weight: 175 lb 6.4 oz (79.6 kg)  Height: 5\' 5"  (1.651 m)   Body mass index is 29.19 kg/m. Focused examination of her neck demonstrates that Steri-Strips are still in place.  I removed these.  The surgical incision is well approximated with a slight  gap in a small portion of the right lateral aspect of the wound.  No drainage is currently present.  Normal postoperative swelling without erythema or induration.   Impression and plan: This is a 40 year old woman with encapsulated follicular variant papillary thyroid carcinoma. with encapsulated follicular variant papillary thyroid carcinoma.  She does not require completion thyroidectomy.  I discussed her hoarseness with her.  There is a little difficult to ascertain how much  of this remains normal from manipulation of the external branch of the superior laryngeal nerve versus possible onset of muscle tension dysphonia or even GERD, given the fact that it is present when she wakes up in the morning.  I discussed the options of a trial of a proton pump inhibitor versus referral to Dr. Johnna Acosta in [place] for evaluation.  She would like to pursue the former at this time.  She was provided a prescription for Protonix 40 mg at night.  She will contact me if she is not seeing improvement in 30 days.  I replaced her Steri-Strips.  Once these fall off, she may begin scar massage.  I discussed using a topical emollient agent to massage her scar to minimize tethering and improve cosmesis.  She should apply sunscreen whenever UV light exposure is anticipated to avoid altered pigmentation of the site.  We will check a TSH, a free T4, and a free T3 in about 6 weeks to determine whether the residual thyroid tissue is providing adequate thyroid hormone.  I will see her in 6 months with the same labs and will perform surveillance ultrasound at that time.

## 2020-07-30 NOTE — Patient Instructions (Addendum)
Please pick up your medication at the pharmacy today. We have placed steri strips today over the wound. These will begin to fall off within 7-10 days. After they fall off use Vitamin E oil and massage into the scar. Be sure to use sunscreen over this area when in the sun.   Please go to [facility] entrance to have your labs drawn in 6 weeks-the week of September 10, 2020.

## 2020-08-16 ENCOUNTER — Other Ambulatory Visit: Payer: Self-pay

## 2020-08-16 ENCOUNTER — Emergency Department: Payer: Self-pay

## 2020-08-16 ENCOUNTER — Emergency Department
Admission: EM | Admit: 2020-08-16 | Discharge: 2020-08-16 | Disposition: A | Discharge status: Home / Self Care | Payer: Self-pay | Attending: Emergency Medicine | Admitting: Emergency Medicine

## 2020-08-16 DIAGNOSIS — Z87891 Personal history of nicotine dependence: Secondary | ICD-10-CM | POA: Insufficient documentation

## 2020-08-16 DIAGNOSIS — Z87442 Personal history of urinary calculi: Secondary | ICD-10-CM | POA: Insufficient documentation

## 2020-08-16 DIAGNOSIS — K529 Noninfective gastroenteritis and colitis, unspecified: Secondary | ICD-10-CM

## 2020-08-16 DIAGNOSIS — K219 Gastro-esophageal reflux disease without esophagitis: Secondary | ICD-10-CM | POA: Insufficient documentation

## 2020-08-16 DIAGNOSIS — Z85850 Personal history of malignant neoplasm of thyroid: Secondary | ICD-10-CM | POA: Insufficient documentation

## 2020-08-16 HISTORY — DX: Malignant (primary) neoplasm, unspecified: C80.1

## 2020-08-16 LAB — COMPREHENSIVE METABOLIC PANEL
ALT: 25 U/L (ref 0–44)
AST: 30 U/L (ref 15–41)
Albumin: 4.3 g/dL (ref 3.5–5.0)
Alkaline Phosphatase: 51 U/L (ref 38–126)
Anion gap: 9 (ref 5–15)
BUN: 10 mg/dL (ref 6–20)
CO2: 25 mmol/L (ref 22–32)
Calcium: 9.1 mg/dL (ref 8.9–10.3)
Chloride: 104 mmol/L (ref 98–111)
Creatinine, Ser: 0.64 mg/dL (ref 0.44–1.00)
GFR, Estimated: >60 mL/min (ref >60)
Glucose, Bld: 98 mg/dL (ref 70–99)
Potassium: 3.9 mmol/L (ref 3.5–5.1)
Sodium: 138 mmol/L (ref 135–145)
Total Bilirubin: 0.8 mg/dL (ref 0.3–1.2)
Total Protein: 7.8 g/dL (ref 6.5–8.1)

## 2020-08-16 LAB — CBC
HCT: 41.8 % (ref 36.0–46.0)
Hemoglobin: 13.9 g/dL (ref 12.0–15.0)
MCH: 28.7 pg (ref 26.0–34.0)
MCHC: 33.3 g/dL (ref 30.0–36.0)
MCV: 86.2 fL (ref 80.0–100.0)
Platelets: 237 10*3/uL (ref 150–400)
RBC: 4.85 MIL/uL (ref 3.87–5.11)
RDW: 13.7 % (ref 11.5–15.5)
WBC: 12.3 10*3/uL — ABNORMAL HIGH (ref 4.0–10.5)
nRBC: 0 % (ref 0.0–0.2)

## 2020-08-16 LAB — URINALYSIS, COMPLETE (UACMP) WITH MICROSCOPIC
Bacteria, UA: NONE SEEN
Bilirubin Urine: NEGATIVE
Glucose, UA: NEGATIVE mg/dL
Hgb urine dipstick: NEGATIVE
Ketones, ur: NEGATIVE mg/dL
Leukocytes,Ua: NEGATIVE
Nitrite: NEGATIVE
Protein, ur: NEGATIVE mg/dL
Specific Gravity, Urine: 1.017 (ref 1.005–1.030)
pH: 6 (ref 5.0–8.0)

## 2020-08-16 LAB — LIPASE, BLOOD: Lipase: 51 U/L (ref 11–51)

## 2020-08-16 LAB — POC URINE PREG, ED: Preg Test, Ur: NEGATIVE

## 2020-08-16 MED ORDER — DICYCLOMINE HCL 10 MG PO CAPS: 10.0000 mg | ORAL_CAPSULE | Freq: Three times a day (TID) | ORAL | 0 refills | Status: AC

## 2020-08-16 MED ORDER — METRONIDAZOLE 500 MG PO TABS: 500.0000 mg | ORAL_TABLET | Freq: Two times a day (BID) | ORAL | 0 refills | Status: AC

## 2020-08-16 MED ORDER — IOHEXOL 300 MG/ML  SOLN
100.0000 mL | Freq: Once | INTRAMUSCULAR | Status: AC | PRN
  Administered 2020-08-16: 100 mL via INTRAVENOUS
  Filled 2020-08-16: qty 100

## 2020-08-16 MED ORDER — CIPROFLOXACIN HCL 500 MG PO TABS: 500.0000 mg | ORAL_TABLET | Freq: Two times a day (BID) | ORAL | 0 refills | Status: AC

## 2020-08-16 MED ORDER — DICYCLOMINE HCL 10 MG PO CAPS
10.0000 mg | ORAL_CAPSULE | Freq: Once | ORAL | Status: AC
  Administered 2020-08-16: 10 mg via ORAL
  Filled 2020-08-16: qty 1

## 2020-08-16 NOTE — ED Provider Notes (Signed)
[facility] Emergency Department Provider Note   ____________________________________________   Event Date/Time   First MD Initiated Contact with Patient 08/16/20 1324     (approximate)  I have reviewed the triage vital signs and the nursing notes.   HISTORY  Chief Complaint Abdominal Pain   HPI Kylie Curtis is a 40 y.o. female presents to the ED with complaint of abdominal pain starting this morning is a sharp stabbing-like pain that at times made her feel like she was doubled over.  Patient states that with this was some nausea but no vomiting.  Pain began around 9 AM.  She states that she had a normal bowel movement this morning.  Patient last ate at approximately 8:30 AM.  Patient initially thought that she had some gas and drink some baking soda which always causes loose stools.  She is unaware of any fever or chills.  Patient states that since being in the lobby her pain has now moved to the right lower quadrant.  Patient rates her pain as 4 out of 10.       Past Medical History:  Diagnosis Date  . Anemia    h/o  . Anxiety    Panic attacks  . Cancer ([place])    thyroid  . Complication of anesthesia    took alot to get to sleep- with wisdom teeth  . Complication of anesthesia    woke up during hand surgery and could see them operating  . Family history of adverse reaction to anesthesia    mom hard to put to sleep  . Fibromyalgia   . GERD (gastroesophageal reflux disease)   . Headache    h/o migraines  . History of kidney stones    h/o  . Hypoglycemia TMJ  . Hypoglycemia   . Pyelonephritis   . Seizure ([place])    x 1 as a teenager none since  . Thyroid nodule   . TMJ syndrome    bilateral    Patient Active Problem List   Diagnosis Date Noted  . S/P partial thyroidectomy 07/15/2020  . Thyroid nodule   . TMJ (dislocation of temporomandibular joint) 07/02/2020  . Bronchitis 11/26/2012  . Hypoglycemia 11/26/2012  . Idiopathic  scoliosis and kyphoscoliosis 11/26/2012  . Myalgia and myositis 11/26/2012  . Polycystic ovaries 11/26/2012    Past Surgical History:  Procedure Laterality Date  . INSERTION OF MESH N/A 12/18/2014   Procedure: INSERTION OF MESH;  Surgeon: Ralene Ok, MD;  Location: [place] [facility];  Service: General;  Laterality: N/A;  . LAPAROSCOPIC ABDOMINAL EXPLORATION    . right hand surgery Right    .Screws and graft- fracture  . TENDON REPAIR Right   . THYROID LOBECTOMY Right 07/15/2020   Procedure: RIGHT THYROID LOBECTOMY;  Surgeon: Fredirick Maudlin, MD;  Location: [facility];  Service: General;  Laterality: Right;  . UMBILICAL HERNIA REPAIR N/A 12/18/2014   Procedure: LAPAROSCOPIC UMBILICAL HERNIA REPAIR WITH MESH;  Surgeon: Ralene Ok, MD;  Location: [place];  Service: General;  Laterality: N/A;  . WISDOM TOOTH EXTRACTION      Prior to Admission medications   Medication Sig Start Date End Date Taking? Authorizing Provider  ciprofloxacin (CIPRO) 500 MG tablet Take 1 tablet (500 mg total) by mouth 2 (two) times daily for 7 days. 08/16/20 08/23/20 Yes Letitia Neri L, PA-C  dicyclomine (BENTYL) 10 MG capsule Take 1 capsule (10 mg total) by mouth 4 (four) times daily -  before meals and at bedtime for  10 days. Prn cramping 08/16/20 08/26/20 Yes Letitia Neri L, PA-C  metroNIDAZOLE (FLAGYL) 500 MG tablet Take 1 tablet (500 mg total) by mouth 2 (two) times daily. 08/16/20  Yes Johnn Hai, PA-C  cholecalciferol (VITAMIN D3) 25 MCG (1000 UNIT) tablet Take 1,000 Units by mouth daily.    [provider]  ibuprofen (ADVIL) 600 MG tablet Take 1 tablet (600 mg total) by mouth every 6 (six) hours as needed. 05/12/19   Melynda Ripple, MD  JOLIVETTE 0.35 MG tablet Take 1 tablet by mouth daily. 11/05/14   [provider]  omeprazole (PRILOSEC) 20 MG capsule Take 20 mg by mouth daily as needed (acid reflux).    [provider]  pantoprazole (PROTONIX) 40 MG tablet Take 1 tablet (40 mg  total) by mouth daily. 07/30/20   Fredirick Maudlin, MD  TURMERIC PO Take 1 tablet by mouth daily.    [provider]  vitamin B-12 (CYANOCOBALAMIN) 1000 MCG tablet Take 1,000 mcg by mouth daily.    [provider]  zinc gluconate 50 MG tablet Take 50 mg by mouth daily.    [provider]    Allergies Codeine, Morphine and related, Tramadol, and Other  Family History  Problem Relation Age of Onset  . Hyperlipidemia Mother   . Hypertension Mother   . Hyperlipidemia Father   . Hypertension Father   . Stroke Father     Social History Social History   Tobacco Use  . Smoking status: Former Smoker    Packs/day: 0.25    Years: 17.00    Pack years: 4.25    Types: Cigarettes    Quit date: 07/09/2015    Years since quitting: 5.1  . Smokeless tobacco: Never Used  Vaping Use  . Vaping Use: Never used  Substance Use Topics  . Alcohol use: No  . Drug use: No    Review of Systems Constitutional: No fever/chills Eyes: No visual changes. ENT: No sore throat. Cardiovascular: Denies chest pain. Respiratory: Denies shortness of breath. Gastrointestinal: Positive for abdominal pain.  Positive nausea, no vomiting.  No diarrhea, positive loose stool.  No constipation. Genitourinary: Negative for dysuria. Musculoskeletal: Negative for back pain. Skin: Negative for rash. Neurological: Negative for headaches, focal weakness or numbness.  ____________________________________________   PHYSICAL EXAM:  VITAL SIGNS: ED Triage Vitals  Enc Vitals Group     BP 08/16/20 1243 133/84     Pulse Rate 08/16/20 1243 98     Resp 08/16/20 1243 17     Temp 08/16/20 1243 98.4 F (36.9 C)     Temp Source 08/16/20 1243 Oral     SpO2 08/16/20 1243 99 %     Weight 08/16/20 1245 170 lb (77.1 kg)     Height 08/16/20 1245 5\' 5"  (1.651 m)     Head Circumference --      Peak Flow --      Pain Score 08/16/20 1245 4     Pain Loc --      Pain Edu? --      Excl. in [place]? --      Constitutional: Alert and oriented. Well appearing and in no acute distress. Eyes: Conjunctivae are normal. Head: Atraumatic. Neck: No stridor.   Cardiovascular: Normal rate, regular rhythm. Grossly normal heart sounds.  Good peripheral circulation. Respiratory: Normal respiratory effort.  No retractions. Lungs CTAB. Gastrointestinal: Soft.  No distention.  Bowel sounds are hypoactive at this time.  There is moderate tenderness on palpation of the right  lower quadrant with rebound tenderness present.  Burney's point tenderness.  There is some minimal periumbilical tenderness noted.  No specific tenderness is noted epigastrium and right upper quadrant to palpation. Musculoskeletal: Moves upper and lower extremities without any difficulty normal gait was noted. Neurologic:  Normal speech and language. No gross focal neurologic deficits are appreciated. No gait instability. Skin:  Skin is warm, dry and intact. No rash noted. Psychiatric: Mood and affect are normal. Speech and behavior are normal.  ____________________________________________   LABS (all labs ordered are listed, but only abnormal results are displayed)  Labs Reviewed  CBC - Abnormal; Notable for the following components:      Result Value   WBC 12.3 (*)    All other components within normal limits  URINALYSIS, COMPLETE (UACMP) WITH MICROSCOPIC - Abnormal; Notable for the following components:   Color, Urine YELLOW (*)    APPearance CLEAR (*)    All other components within normal limits  LIPASE, BLOOD  COMPREHENSIVE METABOLIC PANEL  POC URINE PREG, ED    RADIOLOGY I, Johnn Hai, personally viewed and evaluated these images (plain radiographs) as part of my medical decision making, as well as reviewing the written report by the radiologist.   Official radiology report(s): CT ABDOMEN PELVIS W CONTRAST  Result Date: 08/16/2020 CLINICAL DATA:  Right lower quadrant abdominal pain, appendicitis suspected EXAM:  CT ABDOMEN AND PELVIS WITH CONTRAST TECHNIQUE: Multidetector CT imaging of the abdomen and pelvis was performed using the standard protocol following bolus administration of intravenous contrast. CONTRAST:  122mL OMNIPAQUE IOHEXOL 300 MG/ML  SOLN COMPARISON:  11/12/2014 FINDINGS: Lower chest: No acute abnormality. Partially imaged pectus deformity. Hepatobiliary: There are multiple rounded hyperenhancing liver lesions identified, the largest in the inferior right lobe of the liver, hepatic segment VI measuring 2.9 x 2.8 cm (series 2, image 44). These are not noted on prior examination dated 11/12/2014. There has been interval enlargement of a fluid low-attenuation lesion of the liver dome, measuring 1.2 cm, previously no greater than 5 mm (series 2, image 16). No gallstones, gallbladder wall thickening, or biliary dilatation. Pancreas: Unremarkable. No pancreatic ductal dilatation or surrounding inflammatory changes. Spleen: Normal in size without significant abnormality. Adrenals/Urinary Tract: Adrenal glands are unremarkable. Kidneys are normal, without renal calculi, solid lesion, or hydronephrosis. Bladder is unremarkable. Stomach/Bowel: Stomach is within normal limits. Appendix appears normal (series 2, image 61). There is diffuse mucosal hyperenhancement and fatty mural stratification of the cecum and transverse colon (series 2, image 51). Vascular/Lymphatic: Aortic atherosclerosis. No enlarged abdominal or pelvic lymph nodes. Reproductive: Uterine fibroids. Other: No abdominal wall hernia or abnormality. No abdominopelvic ascites. Musculoskeletal: No acute or significant osseous findings. IMPRESSION: 1. There is diffuse mucosal hyperenhancement and fatty mural stratification of the cecum and transverse colon, consistent acute and chronic findings of nonspecific infectious or inflammatory colitis. This appearance can be seen in inflammatory bowel disease such as Crohn's disease or ulcerative colitis. Correlate  with referable history, if present. 2. Normal appendix. 3. There are multiple rounded hyperenhancing liver lesions identified, not noted on prior examination dated 11/12/2014. 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 Recommend multiphasic contrast enhanced MRI to further evaluate, which can be performed on a nonemergent basis. 4. There has been interval enlargement of a fluid low-attenuation lesion of the liver dome, measuring 1.2 cm, previously no greater than 5 mm. This  is incompletely characterized, although likely a benign cyst or hemangioma. As above, this can be fully characterized by multiphasic contrast enhanced MRI. 5. Uterine fibroids. 6. Aortic atherosclerosis, advanced for patient age. Aortic Atherosclerosis (ICD10-I70.0). Electronically Signed   By: Eddie Candle M.D.   On: 08/16/2020 14:58    ____________________________________________   PROCEDURES  Procedure(s) performed (including Critical Care):  Procedures   ____________________________________________   INITIAL IMPRESSION / ASSESSMENT AND PLAN / ED COURSE  As part of my medical decision making, I reviewed the following data within the electronic MEDICAL RECORD NUMBER Notes from prior ED visits and [place] Controlled Substance Database  40 year old female presents to the ED with complaint of right lower quadrant pain that began early this morning and has become more predominant this afternoon.  Patient states that she experienced a sharp pain and cramping at approximately 9 AM.  Patient states that she felt bloated and took some baking soda and water which produced loose stools which she treated to drinking the baking soda.  Patient reports a history of kidney stones but states this pain is different.  On exam she is moderately tender in the right lower  quadrant and periumbilical area.  Lab work showed a mild elevation of her WBC at 12,000 but remaining lab work is unremarkable.  A CT scan showed most likely colitis such as Crohn's disease or ulcerative colitis.  Patient was given Bentyl while in the ED for abdominal cramping.  Prescription for Cipro and Flagyl along with Bentyl was sent to her pharmacy.  She is to follow-up with Dr. Marius Ditch who is on-call for gastroenterology for further evaluation of her abdominal discomfort and further investigation of her hepatic nodules.  Patient is aware that she should return to the emergency department if any severe worsening of her symptoms over the weekend such as fever, chills, nausea or vomiting.  ____________________________________________   FINAL CLINICAL IMPRESSION(S) / ED DIAGNOSES  Final diagnoses:  Colitis, acute     ED Discharge Orders         Ordered    ciprofloxacin (CIPRO) 500 MG tablet  2 times daily        08/16/20 1536    metroNIDAZOLE (FLAGYL) 500 MG tablet  2 times daily        08/16/20 1536    dicyclomine (BENTYL) 10 MG capsule  3 times daily before meals & bedtime        08/16/20 1536          *Please note:  Kylie Curtis was evaluated in Emergency Department on 08/16/2020 for the symptoms described in the history of present illness. She was evaluated in the context of the global COVID-19 pandemic, which necessitated consideration that the patient might be at risk for infection with the SARS-CoV-2 virus that causes COVID-19. Institutional protocols and algorithms that pertain to the evaluation of patients at risk for COVID-19 are in a state of rapid change based on information released by regulatory bodies including the CDC and federal and state organizations. These policies and algorithms were followed during the patient's care in the ED.  Some ED evaluations and interventions may be delayed as a result of limited staffing during and the pandemic.*   Note:  This document  was prepared using Dragon voice recognition software and may include unintentional dictation errors.    Johnn Hai, PA-C 08/16/20 1548    Lucrezia Starch, MD 08/16/20 [phone]

## 2020-08-16 NOTE — ED Triage Notes (Signed)
Pt c/o "feeling yucky" since Monday and since 9am having sharp, cramping mid abd pain with nausea and loose stools. Denies any black or blood stools.,

## 2020-08-16 NOTE — ED Notes (Signed)
See triage note  Presents with lower abd pain   States pain started this am but she did have some discomfort to right flank last pm  Hx of renal stone but states this was different  While waiting in lobby states pain is mainly moved to RLQ   afebrile

## 2020-08-16 NOTE — Discharge Instructions (Signed)
Call make an appointment with Dr. Marius Ditch who is the gastroenterologist on call today.  Begin taking medication as directed that was sent to your pharmacy.  Cipro is twice a day for the next 7 days along with Flagyl.  Do not drink alcohol with these medications as it could cause stomach upset and vomiting.  Bentyl is a medication for cramping.  Be aware that this could cause some drowsiness but will help with cramps.  Also the gastroenterologist will evaluate the nodules that you have on your liver which appear to be benign (noncancerous).  If any worsening over the weekend return to the emergency department such as nausea, vomiting, fever and chills.

## 2020-08-20 ENCOUNTER — Telehealth: Payer: Self-pay | Admitting: General Surgery

## 2020-08-20 NOTE — Telephone Encounter (Signed)
Patient calls stating that she was told by [place] to call the day before she gets labs done to make sure the orders are in for her to do do.  She will be going tomorrow (08/21/20) for her thyroid labs.  Thank you.

## 2020-08-20 NOTE — Telephone Encounter (Signed)
Spoke with the patient and let her know that the orders are in the system, but she is not to have lab work done for her thyroid until the week of March 22nd. She is aware and will wait until then to have them done.

## 2020-09-09 ENCOUNTER — Other Ambulatory Visit
Admission: RE | Admit: 2020-09-09 | Discharge: 2020-09-09 | Disposition: A | Discharge status: Home / Self Care | Payer: Self-pay | Attending: General Surgery | Admitting: General Surgery

## 2020-09-09 DIAGNOSIS — E890 Postprocedural hypothyroidism: Secondary | ICD-10-CM

## 2020-09-09 LAB — TSH: TSH: 6.028 u[IU]/mL — ABNORMAL HIGH (ref 0.350–4.500)

## 2020-09-09 LAB — T4, FREE: Free T4: 0.65 ng/dL (ref 0.61–1.12)

## 2020-09-16 ENCOUNTER — Other Ambulatory Visit: Payer: Self-pay | Admitting: General Surgery

## 2020-09-16 MED ORDER — LEVOTHYROXINE SODIUM 88 MCG PO TABS: 88.0000 ug | ORAL_TABLET | Freq: Every day | ORAL | 3 refills | Status: AC

## 2021-01-22 ENCOUNTER — Other Ambulatory Visit: Payer: Self-pay

## 2021-01-22 DIAGNOSIS — E041 Nontoxic single thyroid nodule: Secondary | ICD-10-CM

## 2021-01-22 DIAGNOSIS — E890 Postprocedural hypothyroidism: Secondary | ICD-10-CM

## 2021-02-11 ENCOUNTER — Encounter: Payer: Self-pay | Admitting: *Deleted

## 2021-02-11 ENCOUNTER — Other Ambulatory Visit
Admission: RE | Admit: 2021-02-11 | Discharge: 2021-02-11 | Disposition: A | Discharge status: Home / Self Care | Payer: Self-pay | Source: Ambulatory Visit | Attending: General Surgery | Admitting: General Surgery

## 2021-02-11 DIAGNOSIS — E041 Nontoxic single thyroid nodule: Secondary | ICD-10-CM | POA: Insufficient documentation

## 2021-02-11 DIAGNOSIS — E890 Postprocedural hypothyroidism: Secondary | ICD-10-CM | POA: Insufficient documentation

## 2021-02-11 LAB — TSH: TSH: 0.759 u[IU]/mL (ref 0.350–4.500)

## 2021-02-11 LAB — T4, FREE: Free T4: 1.27 ng/dL — ABNORMAL HIGH (ref 0.61–1.12)

## 2021-02-12 LAB — T3, FREE: T3, Free: 3.1 pg/mL (ref 2.0–4.4)

## 2021-02-13 ENCOUNTER — Other Ambulatory Visit: Payer: Self-pay

## 2021-02-13 ENCOUNTER — Ambulatory Visit (INDEPENDENT_AMBULATORY_CARE_PROVIDER_SITE_OTHER): Payer: Self-pay | Admitting: General Surgery

## 2021-02-13 ENCOUNTER — Encounter: Payer: Self-pay | Admitting: General Surgery

## 2021-02-13 ENCOUNTER — Ambulatory Visit: Payer: Self-pay

## 2021-02-13 VITALS — BP 134/86 | HR 96 | Temp 98.1°F | Ht 65.0 in | Wt 178.6 lb

## 2021-02-13 DIAGNOSIS — E890 Postprocedural hypothyroidism: Secondary | ICD-10-CM

## 2021-02-13 MED ORDER — ESOMEPRAZOLE MAGNESIUM 40 MG PO CPDR: 40.0000 mg | DELAYED_RELEASE_CAPSULE | Freq: Every day | ORAL | 6 refills | Status: AC

## 2021-02-13 MED ORDER — LEVOTHYROXINE SODIUM 75 MCG PO TABS: 75.0000 ug | ORAL_TABLET | Freq: Every day | ORAL | 6 refills | Status: AC

## 2021-02-13 NOTE — Patient Instructions (Addendum)
Follow up with Dr.Solum at [facility] clinic. In 6 months. Please pick up your medication at the pharmacy today.

## 2021-02-13 NOTE — Progress Notes (Signed)
Patient ID: Kylie Curtis, female   DOB: 09/04/1980, 40 y.o.   MRN: [id number]  Chief Complaint  Patient presents with   Follow-up    labs    HPI Kylie Curtis is a 40 y.o. female.   She is here today for follow-up of thyroid cancer.  She underwent a right thyroid lobectomy and isthmusectomy in January of this year.  Final pathology demonstrated the following:  DIAGNOSIS:  A. THYROID, RIGHT LOBE AND ISTHMUS; LOBECTOMY:  - PAPILLARY THYROID CARCINOMA, SEE CANCER SUMMARY BELOW.  - ONE LYMPH NODE, NEGATIVE FOR MALIGNANCY (0/1).  - ONE PARATHYROID GLAND WITH NO SIGNIFICANT PATHOLOGIC ALTERATION.   CANCER CASE SUMMARY: THYROID GLAND  Standard(s): AJCC-UICC 8   SPECIMEN  Procedure: Lobectomy   TUMOR  Tumor Focality: Multifocal  Tumor Site: Tumor #1 mid pole, Tumor #2 inferior pole  Tumor Size: Tumor #1 - 2.7 x 2.2 x 2 cm, Tumor #2 - 0.4 x 0.3 x 0.3 cm  Histologic Type: Papillary carcinoma, follicular variant,  encapsulated/well demarcated,  noninvasive  Angioinvasion (Vascular Invasion): Not identified  Lymphatic Invasion: Not identified  Extrathyroidal Extension: Not identified  Margin Status: All margins negative for carcinoma   REGIONAL LYMPH NODES  Regional Lymph Nodes:  Regional lymph nodes present (1), negative for tumor   DISTANT METASTASIS  Distant Site(s) Involved, if applicable (select all that apply): Not  applicable   PATHOLOGIC STAGE CLASSIFICATION (pTNM, AJCC 8th Edition):  TNM Descriptors: M (multifocal)  pT2  pN0  pM - Not applicable    ADDENDUM:  This addendum is issued for clarification as to why the tumor is not  classified as NIFTP (noninvasive follicular thyroid neoplasm with  papillary like nuclear features). Per the CAP Thyroid Cancer Checklist  (AJCC-UICC 8), a tumor may not be classified as NIFTP if it demonstrates  any of the following exclusionary criteria: infiltration/tumoral  capsular invasion, solid/trabecular or insular growth  greater than 30%,  or true papillary growth.  This tumor demonstrates >30% solid/trabecular  growth pattern and is therefore excluded from the NIFTP category.  The  diagnosis above (papillary thyroid carcinoma, follicular variant,  encapsulated/well demarcated, noninvasive) remains unchanged.  The case  was shown for intradepartmental consensus.   Based upon the pathology, she did not require completion thyroidectomy or radioactive iodine remnant ablation.  At her postoperative visit, she was complaining of frequent throat clearing, raspiness to her voice that was worse first thing in the morning, as well as difficulty raising the level of her voice.  She was prescribed a proton pump inhibitor, but did not take it secondary to cost.  She has similar complaints today.  She also endorses fatigue as well as some tenderness and a burning sensation in her scar.  She reports occasional palpitations.  She has chronic diarrhea.  She endorses both heat and cold intolerance.  Her weight has been stable.  She says that her fingernails have become somewhat ridged, no change in her skin or hair.  She has not noticed any new lumps or bumps in her neck.  She is currently taking 88 mcg of levothyroxine daily.  Labs were performed prior to today's visit.   Past Medical History:  Diagnosis Date   Anemia    h/o   Anxiety    Panic attacks   Cancer ([place])    thyroid   Complication of anesthesia    took alot to get to sleep- with wisdom teeth   Complication of anesthesia    woke up during  hand surgery and could see them operating   Family history of adverse reaction to anesthesia    mom hard to put to sleep   Fibromyalgia    GERD (gastroesophageal reflux disease)    Headache    h/o migraines   History of kidney stones    h/o   Hypoglycemia TMJ   Hypoglycemia    Pyelonephritis    Seizure ([place])    x 1 as a teenager none since   Thyroid nodule    TMJ syndrome    bilateral    Past Surgical History:   Procedure Laterality Date   INSERTION OF MESH N/A 12/18/2014   Procedure: INSERTION OF MESH;  Surgeon: Ralene Ok, MD;  Location: [facility];  Service: General;  Laterality: N/A;   LAPAROSCOPIC ABDOMINAL EXPLORATION     right hand surgery Right    .Screws and graft- fracture   TENDON REPAIR Right    THYROID LOBECTOMY Right 07/15/2020   Procedure: RIGHT THYROID LOBECTOMY;  Surgeon: Fredirick Maudlin, MD;  Location: [facility];  Service: General;  Laterality: Right;   UMBILICAL HERNIA REPAIR N/A 12/18/2014   Procedure: LAPAROSCOPIC UMBILICAL HERNIA REPAIR WITH MESH;  Surgeon: Ralene Ok, MD;  Location: [place];  Service: General;  Laterality: N/A;   WISDOM TOOTH EXTRACTION      Family History  Problem Relation Age of Onset   Hyperlipidemia Mother    Hypertension Mother    Hyperlipidemia Father    Hypertension Father    Stroke Father     Social History Social History   Tobacco Use   Smoking status: Former    Packs/day: 0.25    Years: 17.00    Pack years: 4.25    Types: Cigarettes    Quit date: 07/09/2015    Years since quitting: 5.6   Smokeless tobacco: Never  Vaping Use   Vaping Use: Never used  Substance Use Topics   Alcohol use: No   Drug use: No    Allergies  Allergen Reactions   Codeine Itching   Morphine And Related Itching   Tramadol Itching   Other Itching and Rash    Current Outpatient Medications  Medication Sig Dispense Refill   esomeprazole (NEXIUM) 40 MG capsule Take 1 capsule (40 mg total) by mouth daily at 12 noon. 30 capsule 6   levothyroxine (SYNTHROID) 75 MCG tablet Take 1 tablet (75 mcg total) by mouth daily before breakfast. 1 tablet 6   cholecalciferol (VITAMIN D3) 25 MCG (1000 UNIT) tablet Take 1,000 Units by mouth daily.     dicyclomine (BENTYL) 10 MG capsule Take 1 capsule (10 mg total) by mouth 4 (four) times daily -  before meals and at bedtime for 10 days. Prn cramping 40 capsule 0   ibuprofen (ADVIL) 600 MG tablet Take 1 tablet (600 mg  total) by mouth every 6 (six) hours as needed. 30 tablet 0   JOLIVETTE 0.35 MG tablet Take 1 tablet by mouth daily.  9   levothyroxine (SYNTHROID) 88 MCG tablet Take 1 tablet (88 mcg total) by mouth daily before breakfast. 90 tablet 3   metroNIDAZOLE (FLAGYL) 500 MG tablet Take 1 tablet (500 mg total) by mouth 2 (two) times daily. 14 tablet 0   omeprazole (PRILOSEC) 20 MG capsule Take 20 mg by mouth daily as needed (acid reflux).     pantoprazole (PROTONIX) 40 MG tablet Take 1 tablet (40 mg total) by mouth daily. 30 tablet 0   TURMERIC PO Take 1 tablet by mouth daily.  vitamin B-12 (CYANOCOBALAMIN) 1000 MCG tablet Take 1,000 mcg by mouth daily.     zinc gluconate 50 MG tablet Take 50 mg by mouth daily.     No current facility-administered medications for this visit.    Review of Systems Review of Systems  All other systems reviewed and are negative. Or as discussed in the history of present illness.  Blood pressure 134/86, pulse 96, temperature 98.1 F (36.7 C), temperature source Oral, height '5\' 5"'$  (1.651 m), weight 178 lb 9.6 oz (81 kg), SpO2 98 %. Body mass index is 29.72 kg/m.  Physical Exam Physical Exam Constitutional:      General: She is not in acute distress.    Appearance: Normal appearance.  HENT:     Head: Normocephalic and atraumatic.     Nose:     Comments: Covered with a mask    Mouth/Throat:     Comments: Mouth covered with a mask.  Clears throat frequently during the visit. Eyes:     General: No scleral icterus.       Right eye: No discharge.        Left eye: No discharge.     Comments: No proptosis or exophthalmos.  Neck:     Comments: No palpable cervical or supraclavicular lymphadenopathy.  The trachea is midline.  There is a well-healed and fading transverse thyroidectomy scar.  The patient endorses some tenderness along the scar line.  The right lobe of the thyroid is surgically absent.  No dominant masses appreciated in the left lobe.  The residual  gland does move freely with deglutition. Cardiovascular:     Rate and Rhythm: Normal rate and regular rhythm.     Pulses: Normal pulses.  Pulmonary:     Effort: Pulmonary effort is normal.     Breath sounds: Normal breath sounds.  Abdominal:     General: Abdomen is flat. Bowel sounds are normal.     Palpations: Abdomen is soft.  Genitourinary:    Comments: Deferred Musculoskeletal:        General: No swelling, tenderness or deformity.  Skin:    General: Skin is warm and dry.  Neurological:     General: No focal deficit present.     Mental Status: She is alert and oriented to person, place, and time.  Psychiatric:        Mood and Affect: Mood normal.        Behavior: Behavior normal.    Data Reviewed Results for MANAHIL, CAMP (MRN [id number]) as of 02/13/2021 14:57  Ref. Range 09/09/2020 09:24 02/11/2021 14:31  TSH Latest Ref Range: 0.350 - 4.500 uIU/mL 6.028 (H) 0.759  Triiodothyronine,Free,Serum Latest Ref Range: 2.0 - 4.4 pg/mL  3.1  T4,Free(Direct) Latest Ref Range: 0.61 - 1.12 ng/dL 0.65 1.27 (H)   These labs show that in March, her residual thyroid was not producing adequate hormone.  The more recent labs show that she is on slightly too much thyroid medication.  Her goal TSH is between 1 and 2 IU/mL.  I performed a bedside ultrasound in clinic today using the GE Logiq ultrasound and a 12 MHz linear array transducer.  Within the right thyroidectomy bed, there is a small amount of tissue that likely represents a small amount of residual thyroid tissue or scar.  There is no cystic degeneration, microcalcification, or increased vascular flow in this tissue.  The left lobe of the thyroid contains a small nodule near the inferior pole that is hypoechoic and less than 1  cm.  No significant concerning features appreciated.  No pathologic lymphadenopathy appreciated in either lateral compartment.  Assessment This is a 40 year old woman who had a suspicious thyroid nodule that on  final pathology did not turn out to be an encapsulated variant of follicular thyroid carcinoma.  Due to the low risk profile of this tumor, she did not require completion thyroidectomy or radioactive iodine ablation.  She did require the addition of thyroid hormone replacement due to inadequate hormone production by the residual lobe.  At her last visit, I felt that her symptoms were potentially consistent with LPRD, but she did not take the prescribed proton pump inhibitor.  Other potential etiologies include chronic postnasal drainage or muscle tension dysphonia.  Plan After further discussion with the patient, she is willing to try taking the proton pump inhibitor.  We searched GoodRx and found an affordable option for her and a prescription was sent to that pharmacy.  We will drop her thyroid medication to 75 mcg daily.  She will follow-up with Dr. Gabriel Carina for ongoing monitoring of her residual lobe as well as adjustment of her thyroid medications.  If she fails to improve with the proton pump inhibitor, I would recommend referral to Dr. Rowe Clack at [facility] for a more thorough evaluation of her symptoms.    Fredirick Maudlin 02/13/2021, 2:42 PM

## 2021-03-18 ENCOUNTER — Encounter: Payer: Self-pay | Admitting: General Surgery

## 2023-02-24 ENCOUNTER — Ambulatory Visit
Admission: EM | Admit: 2023-02-24 | Discharge: 2023-02-24 | Disposition: A | Discharge status: Home / Self Care | Payer: Self-pay | Attending: Family Medicine | Admitting: Family Medicine

## 2023-02-24 DIAGNOSIS — R21 Rash and other nonspecific skin eruption: Secondary | ICD-10-CM

## 2023-02-24 DIAGNOSIS — L241 Irritant contact dermatitis due to oils and greases: Secondary | ICD-10-CM

## 2023-02-24 MED ORDER — TRIAMCINOLONE ACETONIDE 0.1 % EX OINT: 1.0000 | TOPICAL_OINTMENT | Freq: Two times a day (BID) | CUTANEOUS | 1 refills | Status: AC

## 2023-02-24 MED ORDER — HYDROXYZINE HCL 25 MG PO TABS: 25.0000 mg | ORAL_TABLET | Freq: Three times a day (TID) | ORAL | 0 refills | Status: AC | PRN

## 2023-02-24 MED ORDER — LORATADINE 10 MG PO TABS
10.0000 mg | ORAL_TABLET | Freq: Every evening | ORAL | 0 refills | Status: AC | PRN
Start: 2023-02-24 — End: ?

## 2023-02-24 MED ORDER — DEXAMETHASONE SODIUM PHOSPHATE 10 MG/ML IJ SOLN
10.0000 mg | Freq: Once | INTRAMUSCULAR | Status: AC
  Administered 2023-02-24: 10 mg via INTRAMUSCULAR

## 2023-02-24 NOTE — Discharge Instructions (Signed)
Stop by the pharmacy to pick up your prescriptions.  Follow up with your primary care provider as needed.  

## 2023-02-24 NOTE — ED Provider Notes (Signed)
[facility]    CSN: [id number] Arrival date & time: 02/24/23  0811      History   Chief Complaint Chief Complaint  Patient presents with   Poison Ivy    HPI Kylie Curtis is a 42 y.o. female.   HPI  Kylie Curtis presents for rash after doing yard work.  She believes she came into contact with poison oak.  She has had similar symptoms in the past when she came in contact with poisonous plants.  She has not a lot of itching and the rash is getting worse.  Rash is located on the arms and legs and is red.  She is starting to have a new spot underneath her eye.  She is trying her to scratch but it is very itchy.  She has been using hydrocortisone, aloe vera plants and other the counter treatments without relief.  Kylie Curtis has otherwise been well and has no other concerns.    Past Medical History:  Diagnosis Date   Anemia    h/o   Anxiety    Panic attacks   Cancer ([facility])    thyroid   Complication of anesthesia    took alot to get to sleep- with wisdom teeth   Complication of anesthesia    woke up during hand surgery and could see them operating   Family history of adverse reaction to anesthesia    mom hard to put to sleep   Fibromyalgia    GERD (gastroesophageal reflux disease)    Headache    h/o migraines   History of kidney stones    h/o   Hypoglycemia TMJ   Hypoglycemia    Pyelonephritis    Seizure ([facility])    x 1 as a teenager none since   Thyroid nodule    TMJ syndrome    bilateral    Patient Active Problem List   Diagnosis Date Noted   S/P partial thyroidectomy 07/15/2020   Thyroid nodule    TMJ (dislocation of temporomandibular joint) 07/02/2020   Bronchitis 11/26/2012   Hypoglycemia 11/26/2012   Idiopathic scoliosis and kyphoscoliosis 11/26/2012   Myalgia and myositis 11/26/2012   Polycystic ovaries 11/26/2012    Past Surgical History:  Procedure Laterality Date   INSERTION OF MESH N/A 12/18/2014   Procedure: INSERTION OF MESH;  Surgeon:  Axel Filler, MD;  Location: [facility];  Service: General;  Laterality: N/A;   LAPAROSCOPIC ABDOMINAL EXPLORATION     right hand surgery Right    .Screws and graft- fracture   TENDON REPAIR Right    THYROID LOBECTOMY Right 07/15/2020   Procedure: RIGHT THYROID LOBECTOMY;  Surgeon: Duanne Guess, MD;  Location: [facility];  Service: General;  Laterality: Right;   UMBILICAL HERNIA REPAIR N/A 12/18/2014   Procedure: LAPAROSCOPIC UMBILICAL HERNIA REPAIR WITH MESH;  Surgeon: Axel Filler, MD;  Location: [facility];  Service: General;  Laterality: N/A;   WISDOM TOOTH EXTRACTION      OB History   No obstetric history on file.      Home Medications    Prior to Admission medications   Medication Sig Start Date End Date Taking? Authorizing Provider  cholecalciferol (VITAMIN D3) 25 MCG (1000 UNIT) tablet Take 1,000 Units by mouth daily.   Yes [provider]  hydrOXYzine (ATARAX) 25 MG tablet Take 1 tablet (25 mg total) by mouth every 8 (eight) hours as needed for itching. 02/24/23  Yes Ronnesha Mester, Seward Meth, DO  levothyroxine (SYNTHROID) 75 MCG tablet Take 1 tablet (75 mcg  total) by mouth daily before breakfast. 02/13/21  Yes Duanne Guess, MD  levothyroxine (SYNTHROID) 88 MCG tablet Take 1 tablet (88 mcg total) by mouth daily before breakfast. 09/16/20  Yes Duanne Guess, MD  loratadine (CLARITIN) 10 MG tablet Take 1 tablet (10 mg total) by mouth at bedtime as needed for itching. 02/24/23  Yes Donella Pascarella, DO  triamcinolone ointment (KENALOG) 0.1 % Apply 1 Application topically 2 (two) times daily. 02/24/23  Yes Karas Pickerill, DO  TURMERIC PO Take 1 tablet by mouth daily.   Yes [provider]  vitamin B-12 (CYANOCOBALAMIN) 1000 MCG tablet Take 1,000 mcg by mouth daily.   Yes [provider]  zinc gluconate 50 MG tablet Take 50 mg by mouth daily.   Yes [provider]  dicyclomine (BENTYL) 10 MG capsule Take 1 capsule (10 mg total) by mouth 4 (four) times daily -   before meals and at bedtime for 10 days. Prn cramping 08/16/20 08/26/20  Tommi Rumps, PA-C  esomeprazole (NEXIUM) 40 MG capsule Take 1 capsule (40 mg total) by mouth daily at 12 noon. 02/13/21   Duanne Guess, MD  ibuprofen (ADVIL) 600 MG tablet Take 1 tablet (600 mg total) by mouth every 6 (six) hours as needed. 05/12/19   Domenick Gong, MD  JOLIVETTE 0.35 MG tablet Take 1 tablet by mouth daily. 11/05/14   [provider]  metroNIDAZOLE (FLAGYL) 500 MG tablet Take 1 tablet (500 mg total) by mouth 2 (two) times daily. 08/16/20   Tommi Rumps, PA-C  omeprazole (PRILOSEC) 20 MG capsule Take 20 mg by mouth daily as needed (acid reflux).    [provider]  pantoprazole (PROTONIX) 40 MG tablet Take 1 tablet (40 mg total) by mouth daily. 07/30/20   Duanne Guess, MD    Family History Family History  Problem Relation Age of Onset   Hyperlipidemia Mother    Hypertension Mother    Hyperlipidemia Father    Hypertension Father    Stroke Father     Social History Social History   Tobacco Use   Smoking status: Former    Current packs/day: 0.00    Average packs/day: 0.3 packs/day for 17.0 years (4.3 ttl pk-yrs)    Types: Cigarettes    Start date: 07/08/1998    Quit date: 07/09/2015    Years since quitting: 7.6   Smokeless tobacco: Never  Vaping Use   Vaping status: Never Used  Substance Use Topics   Alcohol use: No   Drug use: No     Allergies   Codeine, Morphine and codeine, Tramadol, and Other   Review of Systems Review of Systems :negative unless otherwise stated in HPI.      Physical Exam Triage Vital Signs ED Triage Vitals  Encounter Vitals Group     BP 02/24/23 0853 131/83     Systolic BP Percentile --      Diastolic BP Percentile --      Pulse Rate 02/24/23 0853 81     Resp 02/24/23 0845 16     Temp 02/24/23 0853 98.3 F (36.8 C)     Temp Source 02/24/23 0845 Oral     SpO2 02/24/23 0853 98 %     Weight 02/24/23 0852 165 lb (74.8 kg)      Height --      Head Circumference --      Peak Flow --      Pain Score 02/24/23 0852 7     Pain Loc --  Pain Education --      Exclude from Growth Chart --    No data found.  Updated Vital Signs BP 131/83 (BP Location: Right Arm)   Pulse 81   Temp 98.3 F (36.8 C) (Oral)   Resp 17   Wt 74.8 kg   SpO2 98%   BMI 27.46 kg/m   Visual Acuity Right Eye Distance:   Left Eye Distance:   Bilateral Distance:    Right Eye Near:   Left Eye Near:    Bilateral Near:     Physical Exam  GEN: alert, well appearing female, scratching EYES: extra occular movements intact, no scleral injection CV: regular rate, brisk cap refill RESP: no increased work of breathing SKIN: warm and dry; erythematous patches with blisterlike lesions on bilateral forearms and upper arms, underneath her right eye, on her left ankle     UC Treatments / Results  Labs (all labs ordered are listed, but only abnormal results are displayed) Labs Reviewed - No data to display  EKG   Radiology No results found.  Procedures Procedures (including critical care time)  Medications Ordered in UC Medications  dexamethasone (DECADRON) injection 10 mg (10 mg Intramuscular Given 02/24/23 0937)    Initial Impression / Assessment and Plan / UC Course  I have reviewed the triage vital signs and the nursing notes.  Pertinent labs & imaging results that were available during my care of the patient were reviewed by me and considered in my medical decision making (see chart for details).     Patient is a 42 y.o. femalewho presents for rash.  Overall, patient is well-appearing and well-hydrated.  Vital signs stable.  [place] is afebrile.  GEN exam concerning for contact dermatitis.  Decadron 10 mg IM.  Patient declined steroid taper as she has had problems with prednisone causing headaches in the past.  Treat with steroid ointment.  Given and hydroxyzine and Claritin for itching.  No sign of infection to  suggest antibiotics or antifungals at this time.     Reviewed expectations regarding course of current medical issues.  All questions asked were answered.  Outlined signs and symptoms indicating need for more acute intervention. Patient verbalized understanding. After Visit Summary given.   Final Clinical Impressions(s) / UC Diagnoses   Final diagnoses:  Irritant contact dermatitis due to oils  Rash and nonspecific skin eruption     Discharge Instructions      Stop by the pharmacy to pick up your prescriptions.  Follow up with your primary care provider as needed.      ED Prescriptions     Medication Sig Dispense Auth. Provider   hydrOXYzine (ATARAX) 25 MG tablet Take 1 tablet (25 mg total) by mouth every 8 (eight) hours as needed for itching. 12 tablet Felix Pratt, DO   loratadine (CLARITIN) 10 MG tablet Take 1 tablet (10 mg total) by mouth at bedtime as needed for itching. 30 tablet Janeka Libman, DO   triamcinolone ointment (KENALOG) 0.1 % Apply 1 Application topically 2 (two) times daily. 30 g Katha Cabal, DO      PDMP not reviewed this encounter.              Katha Cabal, DO 02/24/23 [phone]

## 2023-02-24 NOTE — ED Triage Notes (Signed)
Patient states that she got poison ivy x 2 days. Arms,trunk and face. 7/10
